# Patient Record
Sex: Male | Born: 1955
Health system: Southern US, Community
[De-identification: ages and names within clinical notes are randomized; demographics above are authoritative.]

## PROBLEM LIST (undated history)

## (undated) DIAGNOSIS — K219 Gastro-esophageal reflux disease without esophagitis: Secondary | ICD-10-CM

## (undated) DIAGNOSIS — N400 Enlarged prostate without lower urinary tract symptoms: Secondary | ICD-10-CM

## (undated) DIAGNOSIS — I10 Essential (primary) hypertension: Secondary | ICD-10-CM

## (undated) DIAGNOSIS — N529 Male erectile dysfunction, unspecified: Secondary | ICD-10-CM

## (undated) HISTORY — DX: Benign prostatic hyperplasia without lower urinary tract symptoms: N40.0

## (undated) HISTORY — DX: Male erectile dysfunction, unspecified: N52.9

---

## 1989-05-27 HISTORY — PX: OTHER SURGICAL HISTORY: SHX169

## 2002-05-27 HISTORY — PX: EYE SURGERY: SHX253

## 2006-03-20 ENCOUNTER — Emergency Department: Payer: Self-pay | Admitting: Emergency Medicine

## 2010-08-02 ENCOUNTER — Ambulatory Visit (INDEPENDENT_AMBULATORY_CARE_PROVIDER_SITE_OTHER): Payer: Managed Care, Other (non HMO) | Admitting: Family Medicine

## 2010-08-02 ENCOUNTER — Other Ambulatory Visit: Payer: Self-pay | Admitting: Family Medicine

## 2010-08-02 ENCOUNTER — Encounter: Payer: Self-pay | Admitting: Family Medicine

## 2010-08-02 DIAGNOSIS — R7309 Other abnormal glucose: Secondary | ICD-10-CM | POA: Insufficient documentation

## 2010-08-02 DIAGNOSIS — Z0000 Encounter for general adult medical examination without abnormal findings: Secondary | ICD-10-CM

## 2010-08-02 DIAGNOSIS — N401 Enlarged prostate with lower urinary tract symptoms: Secondary | ICD-10-CM | POA: Insufficient documentation

## 2010-08-02 DIAGNOSIS — Z23 Encounter for immunization: Secondary | ICD-10-CM

## 2010-08-02 DIAGNOSIS — E1165 Type 2 diabetes mellitus with hyperglycemia: Secondary | ICD-10-CM | POA: Insufficient documentation

## 2010-08-02 LAB — LIPID PANEL
LDL Cholesterol: 83 mg/dL (ref 0–99)
Total CHOL/HDL Ratio: 5

## 2010-08-02 LAB — BASIC METABOLIC PANEL
CO2: 28 mEq/L (ref 19–32)
Chloride: 102 mEq/L (ref 96–112)
Potassium: 4.6 mEq/L (ref 3.5–5.1)

## 2010-08-02 LAB — HEPATIC FUNCTION PANEL
ALT: 41 U/L (ref 0–53)
AST: 23 U/L (ref 0–37)
Bilirubin, Direct: 0.2 mg/dL (ref 0.0–0.3)
Total Bilirubin: 1 mg/dL (ref 0.3–1.2)
Total Protein: 7.1 g/dL (ref 6.0–8.3)

## 2010-08-02 LAB — PSA: PSA: 0.57 ng/mL (ref 0.10–4.00)

## 2010-08-02 LAB — HEMOGLOBIN A1C: Hgb A1c MFr Bld: 9.7 % — ABNORMAL HIGH (ref 4.6–6.5)

## 2010-08-07 NOTE — Assessment & Plan Note (Signed)
Summary: NEW PATIENT TO EST/CPX/CLE  AETNA   Vital Signs:  Patient profile:   55 year old male Height:      73 inches Weight:      235.50 pounds BMI:     31.18 Temp:     97.4 degrees F oral Pulse rate:   84 / minute Pulse rhythm:   regular BP sitting:   116 / 78  (left arm) Cuff size:   large  Vitals Entered By: Delilah Shan CMA Melodie Ashworth Dull) (August 02, 2010 9:38 AM) CC: New Patient to Establish   History of Present Illness: NP CPE.  See plan.    1 prev high reading of BP, but no dx of htn.    Prev with fasting sugar >200.  He has been checking his sugar occ at home.  120-180 in AM.  Lost 15lbs with diet changes.  Inc in UOP at night, less in the day.  Some thirst noted by patient.    H/o BPH.  Unclear history of cancer in the family.   H/o ED with response to cialis.    Occ abdominal pain at rest near epigastrum.  Worse after big meals.  No exertional symptoms.  No vomiting.  Not short of breath.   Preventive Screening-Counseling & Management  Alcohol-Tobacco     Smoking Status: current  Caffeine-Diet-Exercise     Does Patient Exercise: no  Allergies (verified): No Known Drug Allergies  Past History:  Past Medical History: DIABETES MELLITUS, TYPE II (ICD-250.00) SCREENING, COLON CANCER (ICD-V76.51) HEALTH SCREENING (ICD-V70.0) HYPERTROPHY PROSTATE W/UR OBST & OTH LUTS (ICD-600.01) FAMILY HISTORY DIABETES 1ST DEGREE RELATIVE (ICD-V18.0)    Past Surgical History: 1991  Plantar fasciitis surgery 2004  Lasik surgery  Family History: Reviewed history and no changes required. Family History Diabetes 1st degree relative, parents, grandparents Family History of Glaucoma, parents, grandparents F alive, glaucoma, CAD s/p stent M alive, glaucoma, DM2  Social History: Reviewed history and no changes required. Occupation:  Airline pilot, [employer], travels [employer] Education:  [employer] Married, 1987.  2 kids, 2 grandkids Current Smoker, cigars (2 or 3 per  month) Alcohol use-no [place]upation[place]  employed Smoking Status:  current Does Patient Exercise:  no  Review of Systems       See HPI.  Otherwise negative.    Physical Exam  General:  GEN: nad, alert and oriented HEENT: mucous membranes moist NECK: supple w/o LA CV: rrr.  no murmur PULM: ctab, no inc wob ABD: soft, +bs EXT: no edema SKIN: no acute rash  Prostate:  Prostate gland firm and smooth, ,minimal  enlargement, but no nodularity, tenderness, mass, asymmetry or induration.   Impression & Recommendations:  Problem # 1:  Preventive Health Care (ICD-V70.0) flu shot encouraged.  Tdap today.  D/w patient [id number] and exercise.  PSA collected.  see notes on labs.  I think he likely has gerd- benign abdominal exam.  I would rec otc tx and follow up as needed.   Problem # 2:  HYPERGLYCEMIA (ICD-790.29) see notes on labs . Orders: TLB-BMP (Basic Metabolic Panel-BMET) (80048-METABOL) TLB-Hepatic/Liver Function Pnl (80076-HEPATIC) TLB-Lipid Panel (80061-LIPID) TLB-A1C / Hgb A1C (Glycohemoglobin) (83036-A1C)  Problem # 3:  HYPERTROPHY PROSTATE W/UR OBST & OTH LUTS (ICD-600.01) psa wnl.  no change in meds for this.   Orders: TLB-PSA (Prostate Specific Antigen) (84153-PSA)  Other Orders: Venipuncture (04540) Specimen Handling (98119) Tdap => 76yrs IM (14782) Admin 1st Vaccine (95621) Gastroenterology Referral (GI)  Patient Instructions: 1)  We'll contact you with your lab report and let you know about the details at that point.  2)  Look at the low carb/eat right diet and try to cut out sugar and starches. 3)  Increase your exercise- start walking 3 times a week for 4)  See Shirlee Limerick about your referral before your leave today.   5)  Drop off a copy of your old labs.   6)  You can take tums for your abdominal pain.  See if that helps.     Orders Added: 1)  New Patient 40-64 years [99386] 2)  Venipuncture [57846] 3)   Specimen Handling [99000] 4)  Tdap => 37yrs IM [90715] 5)  Admin 1st Vaccine [90471] 6)  Gastroenterology Referral [GI] 7)  TLB-BMP (Basic Metabolic Panel-BMET) [80048-METABOL] 8)  TLB-Hepatic/Liver Function Pnl [80076-HEPATIC] 9)  TLB-Lipid Panel [80061-LIPID] 10)  TLB-A1C / Hgb A1C (Glycohemoglobin) [83036-A1C] 11)  TLB-PSA (Prostate Specific Antigen) [96295-MWU]   Immunizations Administered:  Tetanus Vaccine:    Vaccine Type: Tdap    Site: left deltoid    Mfr: GlaxoSmithKline    Dose: 0.5 ml    Route: IM    Given by: Delilah Shan CMA (AAMA)    Exp. Date: 03/15/2012    Lot #: [id number]    VIS given: 04/13/08 version given August 02, 2010.   Immunizations Administered:  Tetanus Vaccine:    Vaccine Type: Tdap    Site: left deltoid    Mfr: GlaxoSmithKline    Dose: 0.5 ml    Route: IM    Given by: Delilah Shan CMA (AAMA)    Exp. Date: 03/15/2012    Lot #: [id number]    VIS given: 04/13/08 version given August 02, 2010.  Prior Medications (reviewed today): None Current Allergies (reviewed today): No known allergies

## 2010-08-20 ENCOUNTER — Ambulatory Visit: Payer: Self-pay | Admitting: Gastroenterology

## 2010-10-27 ENCOUNTER — Encounter: Payer: Self-pay | Admitting: Family Medicine

## 2010-11-02 ENCOUNTER — Other Ambulatory Visit (INDEPENDENT_AMBULATORY_CARE_PROVIDER_SITE_OTHER): Payer: Managed Care, Other (non HMO) | Admitting: Family Medicine

## 2010-11-02 ENCOUNTER — Other Ambulatory Visit: Payer: Managed Care, Other (non HMO)

## 2010-11-02 DIAGNOSIS — E119 Type 2 diabetes mellitus without complications: Secondary | ICD-10-CM

## 2010-11-02 LAB — HEMOGLOBIN A1C: Hgb A1c MFr Bld: 7.7 % — ABNORMAL HIGH (ref 4.6–6.5)

## 2010-11-08 ENCOUNTER — Ambulatory Visit (INDEPENDENT_AMBULATORY_CARE_PROVIDER_SITE_OTHER): Payer: Managed Care, Other (non HMO) | Admitting: Family Medicine

## 2010-11-08 ENCOUNTER — Encounter: Payer: Self-pay | Admitting: Family Medicine

## 2010-11-08 VITALS — BP 110/78 | HR 88 | Temp 98.5°F | Ht 73.0 in | Wt 235.0 lb

## 2010-11-08 DIAGNOSIS — E119 Type 2 diabetes mellitus without complications: Secondary | ICD-10-CM

## 2010-11-08 NOTE — Patient Instructions (Addendum)
Check the American Diabetes Association website.  Keep working on your diet and see if the numbness in your R foot changes any.  If it gets worse, let me know.  Check your sugar episodically.   Call me if you have questions.  Call the pharmacy about getting the strips for your meter (let them call us). Come back for labs in 4 months and see me a few days after that.

## 2010-11-08 NOTE — Progress Notes (Signed)
Diabetes:  Using medications without difficulties:no meds Hypoglycemic episodes:occ and he snacks to resolve it Hyperglycemic episodes:no Feet problems:no Blood Sugars averaging: 150-170 now, down from ~215 before Walking for exercise, down about 10lbs.  Normal eye exam per patient in last month.  Labs dw pt.  R 1st toe is numb, worse during the day.  This may be positional.  No other neuro sx.    PMH and SH reviewed  Meds, vitals, and allergies reviewed.   ROS: See HPI.  Otherwise negative.    GEN: nad, alert and oriented HEENT: mucous membranes moist NECK: supple w/o LA CV: rrr. PULM: ctab, no inc wob ABD: soft, +bs EXT: no edema SKIN: no acute rash  Diabetic foot exam: Normal inspection No skin breakdown No calluses  Normal DP pulses Normal sensation to light touch and monofilament except on R first toe Nails normal

## 2010-11-08 NOTE — Assessment & Plan Note (Signed)
Improved with diet and weight loss.  Labs d/w pt.  Continue as is and fu for labs as scheduled.  No new meds. He understood.

## 2011-01-21 ENCOUNTER — Other Ambulatory Visit: Payer: Self-pay | Admitting: *Deleted

## 2011-01-21 MED ORDER — LANCETS MISC: Status: AC

## 2011-01-21 MED ORDER — GLUCOSE BLOOD VI STRP: ORAL_STRIP | Status: AC

## 2011-01-21 NOTE — Telephone Encounter (Signed)
Please call pt. He doesn't have to check glucose daily if the trend has been stable; he can just check it episodically.  Thanks.

## 2011-01-21 NOTE — Telephone Encounter (Signed)
Pt is asking that test strips and lancets [facility] [address]  These are not on his med list, added for sake of refilling.  Pt states he checks his blood sugar 3 or 4 times a day.

## 2011-01-22 NOTE — Telephone Encounter (Signed)
LMOVM to return call.

## 2011-01-23 NOTE — Telephone Encounter (Signed)
LMOVM of home number. 

## 2011-02-28 ENCOUNTER — Other Ambulatory Visit (INDEPENDENT_AMBULATORY_CARE_PROVIDER_SITE_OTHER): Payer: Managed Care, Other (non HMO)

## 2011-02-28 DIAGNOSIS — E119 Type 2 diabetes mellitus without complications: Secondary | ICD-10-CM

## 2011-03-06 ENCOUNTER — Ambulatory Visit: Payer: Managed Care, Other (non HMO) | Admitting: Family Medicine

## 2011-03-08 ENCOUNTER — Ambulatory Visit (INDEPENDENT_AMBULATORY_CARE_PROVIDER_SITE_OTHER): Payer: Managed Care, Other (non HMO) | Admitting: Family Medicine

## 2011-03-08 ENCOUNTER — Encounter: Payer: Self-pay | Admitting: Family Medicine

## 2011-03-08 ENCOUNTER — Encounter: Payer: Managed Care, Other (non HMO) | Admitting: Family Medicine

## 2011-03-08 VITALS — BP 132/86 | HR 86 | Temp 98.4°F | Wt 236.0 lb

## 2011-03-08 DIAGNOSIS — Z23 Encounter for immunization: Secondary | ICD-10-CM

## 2011-03-08 DIAGNOSIS — E119 Type 2 diabetes mellitus without complications: Secondary | ICD-10-CM

## 2011-03-08 DIAGNOSIS — Z0100 Encounter for examination of eyes and vision without abnormal findings: Secondary | ICD-10-CM

## 2011-03-08 DIAGNOSIS — Z029 Encounter for administrative examinations, unspecified: Secondary | ICD-10-CM

## 2011-03-08 DIAGNOSIS — Z0110 Encounter for examination of ears and hearing without abnormal findings: Secondary | ICD-10-CM

## 2011-03-08 LAB — POCT URINALYSIS DIPSTICK
Bilirubin, UA: NEGATIVE
Glucose, UA: 250
Ketones, UA: NEGATIVE
Leukocytes, UA: NEGATIVE
Nitrite, UA: NEGATIVE

## 2011-03-08 NOTE — Progress Notes (Signed)
Diabetes:  Using medications without difficulties:no meds Hypoglycemic episodes:no Hyperglycemic episodes:no Feet problems:no Blood Sugars averaging: 150-175 2 hours after a meal, checked episodically  eye exam within last year: yes He's working on low carb diet. He had job changes and that affected exercise routine, but he's working on this  DOT physical. See scanned forms.    PMH and SH reviewed  Meds, vitals, and allergies reviewed.   ROS: See HPI.  Otherwise negative.    GEN: nad, alert and oriented HEENT: mucous membranes moist NECK: supple w/o LA CV: rrr. PULM: ctab, no inc wob ABD: soft, +bs EXT: no edema SKIN: no acute rash See additional items on scanned forms.    Diabetic foot exam: Normal inspection No skin breakdown No calluses  Normal DP pulses Normal sensation to light touch and monofilament Nails normal   Labs d/w pt.

## 2011-03-08 NOTE — Patient Instructions (Signed)
I want to recheck your labs in 3 months before a OV.  Come in fasting for labs.   I want your to work on your diet and start back exercising.   Let me know if your have concerns in the meantime.

## 2011-03-10 ENCOUNTER — Encounter: Payer: Self-pay | Admitting: Family Medicine

## 2011-03-10 DIAGNOSIS — Z029 Encounter for administrative examinations, unspecified: Secondary | ICD-10-CM | POA: Insufficient documentation

## 2011-03-10 NOTE — Assessment & Plan Note (Signed)
Labs d/w pt, along with diet and exercise.  >25 min spent with face to face with patient, >50% counseling.  Return for labs in 3 months.  He agrees.

## 2011-03-10 NOTE — Assessment & Plan Note (Signed)
DOT forms done, see scanned forms.

## 2011-03-14 ENCOUNTER — Encounter: Payer: Managed Care, Other (non HMO) | Admitting: Family Medicine

## 2011-06-06 ENCOUNTER — Other Ambulatory Visit: Payer: Managed Care, Other (non HMO)

## 2011-06-13 ENCOUNTER — Ambulatory Visit: Payer: Managed Care, Other (non HMO) | Admitting: Family Medicine

## 2011-07-02 ENCOUNTER — Other Ambulatory Visit (INDEPENDENT_AMBULATORY_CARE_PROVIDER_SITE_OTHER): Payer: Managed Care, Other (non HMO)

## 2011-07-02 DIAGNOSIS — E119 Type 2 diabetes mellitus without complications: Secondary | ICD-10-CM

## 2011-07-02 LAB — COMPREHENSIVE METABOLIC PANEL
Albumin: 4.3 g/dL (ref 3.5–5.2)
Alkaline Phosphatase: 69 U/L (ref 39–117)
BUN: 15 mg/dL (ref 6–23)
CO2: 28 mEq/L (ref 19–32)
GFR: 64.42 mL/min (ref >60.00)
Glucose, Bld: 242 mg/dL — ABNORMAL HIGH (ref 70–99)
Sodium: 136 mEq/L (ref 135–145)
Total Bilirubin: 0.9 mg/dL (ref 0.3–1.2)
Total Protein: 7.5 g/dL (ref 6.0–8.3)

## 2011-07-02 LAB — MICROALBUMIN / CREATININE URINE RATIO
Microalb Creat Ratio: 0.6 mg/g (ref 0.0–30.0)
Microalb, Ur: 1.4 mg/dL (ref 0.0–1.9)

## 2011-07-02 LAB — LIPID PANEL
Cholesterol: 152 mg/dL (ref 0–200)
HDL: 34.5 mg/dL — ABNORMAL LOW (ref >39.00)
VLDL: 34.4 mg/dL (ref 0.0–40.0)

## 2011-07-02 LAB — HEMOGLOBIN A1C: Hgb A1c MFr Bld: 9.2 % — ABNORMAL HIGH (ref 4.6–6.5)

## 2011-07-04 ENCOUNTER — Other Ambulatory Visit: Payer: Managed Care, Other (non HMO)

## 2011-07-11 ENCOUNTER — Encounter: Payer: Self-pay | Admitting: Family Medicine

## 2011-07-11 ENCOUNTER — Ambulatory Visit (INDEPENDENT_AMBULATORY_CARE_PROVIDER_SITE_OTHER): Payer: Managed Care, Other (non HMO) | Admitting: Family Medicine

## 2011-07-11 VITALS — BP 112/84 | HR 83 | Temp 98.2°F | Wt 234.0 lb

## 2011-07-11 DIAGNOSIS — E119 Type 2 diabetes mellitus without complications: Secondary | ICD-10-CM

## 2011-07-11 MED ORDER — METFORMIN HCL 500 MG PO TABS: ORAL_TABLET | ORAL | Status: DC

## 2011-07-11 NOTE — Progress Notes (Signed)
Diabetes:  Using medications without difficulties:no meds Hypoglycemic episodes:no Hyperglycemic episodes:yes Feet problems: no Blood Sugars averaging: ~200 fasting a1c back up to 9.2.   He's skipping breakfast.  We talked about this.    PMH and SH reviewed  Meds, vitals, and allergies reviewed.   ROS: See HPI.  Otherwise negative.    GEN: nad, alert and oriented HEENT: mucous membranes moist NECK: supple w/o LA CV: rrr. PULM: ctab, no inc wob ABD: soft, +bs EXT: no edema SKIN: no acute rash  Diabetic foot exam: Normal inspection No skin breakdown No calluses  Normal DP pulses Normal sensation to light touch and monofilament Nails normal

## 2011-07-11 NOTE — Patient Instructions (Signed)
Look up the american diabetes association and read about type 2 DM.   Start eating breakfast.   Recheck A1c in 3 months and come see me a few days after that.  Gradually increase the metformin, up to 2 pills twice a day.

## 2011-07-15 NOTE — Assessment & Plan Note (Signed)
Uncontrolled, start metformin, eat 3 meals a day.  D/w pt about path/phys of DM2.  >25 min spent with face to face with patient, >50% counseling and/or coordinating care.  Recheck A1c in 3 months.

## 2011-09-18 ENCOUNTER — Encounter: Payer: Self-pay | Admitting: Family Medicine

## 2011-09-18 ENCOUNTER — Ambulatory Visit (INDEPENDENT_AMBULATORY_CARE_PROVIDER_SITE_OTHER): Payer: Managed Care, Other (non HMO) | Admitting: Family Medicine

## 2011-09-18 VITALS — BP 140/90 | HR 58 | Temp 100.5°F | Wt 227.0 lb

## 2011-09-18 DIAGNOSIS — J069 Acute upper respiratory infection, unspecified: Secondary | ICD-10-CM

## 2011-09-18 MED ORDER — AMOXICILLIN-POT CLAVULANATE 875-125 MG PO TABS: 1.0000 | ORAL_TABLET | Freq: Two times a day (BID) | ORAL | Status: AC

## 2011-09-18 MED ORDER — HYDROCODONE-HOMATROPINE 5-1.5 MG/5ML PO SYRP: 5.0000 mL | ORAL_SOLUTION | Freq: Three times a day (TID) | ORAL | Status: DC | PRN

## 2011-09-18 NOTE — Progress Notes (Signed)
SUBJECTIVE:  Matthew Burns is a 56 y.o. male who complains of coryza, congestion, sneezing, sore throat, myalgias and fever for 10 days. He denies a history of anorexia, chest pain, chills and dizziness and denies a history of asthma. Patient admits to smoke cigarettes.   Patient Active Problem List  Diagnoses  . DIABETES MELLITUS, TYPE II  . HYPERTROPHY PROSTATE W/UR OBST & OTH LUTS  . Encounter for administrative examinations   Past Medical History  Diagnosis Date  . Diabetes mellitus     typeII  . BPH (benign prostatic hypertrophy)    Past Surgical History  Procedure Date  . Planter fascitis surgery 1991  . Eye surgery 2004    Lasik   History  Substance Use Topics  . Smoking status: Former Smoker    Types: Cigars    Quit date: 08/09/2010  . Smokeless tobacco: Never Used  . Alcohol Use: No   Family History  Problem Relation Age of Onset  . Diabetes Mother     glaucoma  . Heart disease Father     CAD s/p stent//glaucoma  . Diabetes Maternal Grandmother   . Diabetes Maternal Grandfather   . Diabetes Paternal Grandmother   . Diabetes Paternal Grandfather    No Known Allergies Current Outpatient Prescriptions on File Prior to Visit  Medication Sig Dispense Refill  . glucose blood (ONE TOUCH ULTRA TEST) test strip Use as instructed  100 each  3  . Lancets MISC Use as directed  100 each  3  . metFORMIN (GLUCOPHAGE) 500 MG tablet Take up to 2 pills twice a day  360 tablet  3   The PMH, PSH, Social History, Family History, Medications, and allergies have been reviewed in [facility], and have been updated if relevant.  OBJECTIVE: BP 140/90  Pulse 58  Temp(Src) 100.5 F (38.1 C) (Oral)  Wt 227 lb (102.967 kg)  He appears well, vital signs are as noted. Ears normal.  Throat and pharynx normal.  Neck supple. No adenopathy in the neck. Nose is congested. Sinuses non tender. The chest is clear, without wheezes or rales.  ASSESSMENT:  sinusitis  PLAN: Given duration  and progression of symptoms, will treat for bacterial sinusitis. Symptomatic therapy suggested: push fluids, rest and return office visit prn if symptoms persist or worsen.  Call or return to clinic prn if these symptoms worsen or fail to improve as anticipated.

## 2011-09-18 NOTE — Patient Instructions (Signed)
Take antibiotic as directed- Augmentin - 1 tablet twice daily for 10 days.  Drink lots of fluids.  Treat sympotmatically with Mucinex, nasal saline irrigation, and Tylenol/Ibuprofen. Also try claritin D or zyrtec D over the counter- two times a day as needed ( have to sign for them at pharmacy). You can use warm compresses.  Cough suppressant at night. Call if not improving as expected in 5-7 days.    

## 2011-09-26 ENCOUNTER — Other Ambulatory Visit: Payer: Self-pay

## 2011-09-26 MED ORDER — HYDROCODONE-HOMATROPINE 5-1.5 MG/5ML PO SYRP: 5.0000 mL | ORAL_SOLUTION | Freq: Three times a day (TID) | ORAL | Status: DC | PRN

## 2011-09-26 NOTE — Telephone Encounter (Signed)
Pt walked in; pt saw Dr Dayton Martes 09/18/11. Pt has one more day of antibiotic and is feeling better except non productive cough started again yesterday. No wheezing, no trouble brathing and no fever. Pt request Hycod[facility] refill [facility] to [facility] and pt can be reached at [phone] or [phone].Please advise.

## 2011-09-26 NOTE — Telephone Encounter (Signed)
[facility], [facility] advised.

## 2011-10-01 ENCOUNTER — Other Ambulatory Visit (INDEPENDENT_AMBULATORY_CARE_PROVIDER_SITE_OTHER): Payer: Managed Care, Other (non HMO)

## 2011-10-01 DIAGNOSIS — E119 Type 2 diabetes mellitus without complications: Secondary | ICD-10-CM

## 2011-10-08 ENCOUNTER — Ambulatory Visit: Payer: Managed Care, Other (non HMO) | Admitting: Family Medicine

## 2011-10-09 ENCOUNTER — Ambulatory Visit (INDEPENDENT_AMBULATORY_CARE_PROVIDER_SITE_OTHER): Payer: Managed Care, Other (non HMO) | Admitting: Family Medicine

## 2011-10-09 ENCOUNTER — Encounter: Payer: Self-pay | Admitting: Family Medicine

## 2011-10-09 VITALS — BP 144/90 | HR 84 | Temp 98.3°F | Ht 73.0 in | Wt 234.5 lb

## 2011-10-09 DIAGNOSIS — E119 Type 2 diabetes mellitus without complications: Secondary | ICD-10-CM

## 2011-10-09 MED ORDER — METFORMIN HCL 1000 MG PO TABS: 1000.0000 mg | ORAL_TABLET | ORAL | Status: DC

## 2011-10-09 NOTE — Assessment & Plan Note (Signed)
Continue work on diet.  No change in meds, continue as is.  Recheck A1c in 3 months, consider ACE at that point.  Will need lipids rechecked as weight/A1c come down.  He understood.

## 2011-10-09 NOTE — Progress Notes (Signed)
Diabetes:  Using medications without difficulties:yes, max 1000mg  of metformin a day Hypoglycemic episodes: yes but improved Hyperglycemic episodes:no Feet problems: no Blood Sugars averaging: ~200 in AM and this is improved.  He's working on diet, working on portion control and getting 3 meals a day.    A1c improved to 8.1.    Meds, vitals, and allergies reviewed.   ROS: See HPI.  Otherwise negative.    GEN: nad, alert and oriented HEENT: mucous membranes moist NECK: supple w/o LA CV: rrr. PULM: ctab, no inc wob ABD: soft, +bs EXT: no edema SKIN: no acute rash

## 2011-10-09 NOTE — Patient Instructions (Signed)
Recheck A1c in 3 months.  Come see me after that.  Keep working on [employer] and walking.

## 2012-01-09 ENCOUNTER — Other Ambulatory Visit (INDEPENDENT_AMBULATORY_CARE_PROVIDER_SITE_OTHER): Payer: Managed Care, Other (non HMO)

## 2012-01-09 DIAGNOSIS — E119 Type 2 diabetes mellitus without complications: Secondary | ICD-10-CM

## 2012-01-09 LAB — HEMOGLOBIN A1C: Hgb A1c MFr Bld: 7.8 % — ABNORMAL HIGH (ref 4.6–6.5)

## 2012-01-16 ENCOUNTER — Encounter: Payer: Self-pay | Admitting: Family Medicine

## 2012-01-16 ENCOUNTER — Ambulatory Visit (INDEPENDENT_AMBULATORY_CARE_PROVIDER_SITE_OTHER): Payer: Managed Care, Other (non HMO) | Admitting: Family Medicine

## 2012-01-16 VITALS — BP 120/84 | HR 78 | Temp 98.3°F | Wt 231.8 lb

## 2012-01-16 DIAGNOSIS — R05 Cough: Secondary | ICD-10-CM

## 2012-01-16 DIAGNOSIS — N529 Male erectile dysfunction, unspecified: Secondary | ICD-10-CM

## 2012-01-16 DIAGNOSIS — Z125 Encounter for screening for malignant neoplasm of prostate: Secondary | ICD-10-CM

## 2012-01-16 DIAGNOSIS — E119 Type 2 diabetes mellitus without complications: Secondary | ICD-10-CM

## 2012-01-16 MED ORDER — TADALAFIL 20 MG PO TABS: 10.0000 mg | ORAL_TABLET | ORAL | Status: DC | PRN

## 2012-01-16 MED ORDER — HYDROCODONE-HOMATROPINE 5-1.5 MG/5ML PO SYRP: 5.0000 mL | ORAL_SOLUTION | Freq: Three times a day (TID) | ORAL | Status: AC | PRN

## 2012-01-16 NOTE — Progress Notes (Signed)
Diabetes:  Using medications without difficulties:yes Hypoglycemic episodes: no Hyperglycemic episodes:no Feet problems: no Blood Sugars averaging: not checked recently.   Getting 3 meals a day, losing weight.  Exercising moderately.   A1c improved to 7.8.    ED.  Had used cialis w/o ADE prev. Asking about restarting med. Intolerant of viagra.   Had been coughing last week, likely with a cold.  Better now.  No fevers.  Had used some leftover hydrocodone from prev illness with some relief.    PMH and SH reviewed  Meds, vitals, and allergies reviewed.   ROS: See HPI.  Otherwise negative.    GEN: nad, alert and oriented HEENT: mucous membranes moist, TM/nasal/OP exam wnl NECK: supple w/o LA CV: rrr. PULM: ctab, no inc wob ABD: soft, +bs EXT: no edema SKIN: no acute rash

## 2012-01-16 NOTE — Patient Instructions (Addendum)
Keep working on your weight and recheck labs before a physical in 6 months.  Take care.   Use the cough medicine as needed for a few days.  Sedation caution.

## 2012-01-16 NOTE — Assessment & Plan Note (Signed)
Consider ACE and statin in future.  BP okay for now.  He'll continue work on diet and exercise.  He can likely get A1c lower w/o other meds.  He agrees.  See instructions.  Labs d/w pt. Continue current meds.

## 2012-01-16 NOTE — Assessment & Plan Note (Signed)
Restart cialis, has tolerated prev.

## 2012-01-16 NOTE — Assessment & Plan Note (Signed)
Lungs cleaer, likely has mild postinfectious sx.  D/w pt.  Can use hycodan prn for a few days.  Fu prn

## 2012-03-10 ENCOUNTER — Other Ambulatory Visit: Payer: Managed Care, Other (non HMO)

## 2012-07-13 ENCOUNTER — Other Ambulatory Visit (INDEPENDENT_AMBULATORY_CARE_PROVIDER_SITE_OTHER): Payer: Managed Care, Other (non HMO)

## 2012-07-13 DIAGNOSIS — Z125 Encounter for screening for malignant neoplasm of prostate: Secondary | ICD-10-CM

## 2012-07-13 DIAGNOSIS — E119 Type 2 diabetes mellitus without complications: Secondary | ICD-10-CM

## 2012-07-13 LAB — COMPREHENSIVE METABOLIC PANEL
Albumin: 3.9 g/dL (ref 3.5–5.2)
CO2: 27 mEq/L (ref 19–32)
Calcium: 8.5 mg/dL (ref 8.4–10.5)
Chloride: 97 mEq/L (ref 96–112)
GFR: 76.08 mL/min (ref >60.00)
Glucose, Bld: 210 mg/dL — ABNORMAL HIGH (ref 70–99)
Potassium: 3.8 mEq/L (ref 3.5–5.1)
Sodium: 134 mEq/L — ABNORMAL LOW (ref 135–145)
Total Protein: 6.8 g/dL (ref 6.0–8.3)

## 2012-07-13 LAB — LIPID PANEL: HDL: 27.4 mg/dL — ABNORMAL LOW (ref >39.00)

## 2012-07-13 LAB — HEMOGLOBIN A1C: Hgb A1c MFr Bld: 9.5 % — ABNORMAL HIGH (ref 4.6–6.5)

## 2012-07-20 ENCOUNTER — Encounter: Payer: Self-pay | Admitting: Family Medicine

## 2012-07-20 ENCOUNTER — Ambulatory Visit (INDEPENDENT_AMBULATORY_CARE_PROVIDER_SITE_OTHER): Payer: Managed Care, Other (non HMO) | Admitting: Family Medicine

## 2012-07-20 VITALS — BP 142/84 | HR 81 | Temp 98.9°F | Wt 227.0 lb

## 2012-07-20 DIAGNOSIS — Z8042 Family history of malignant neoplasm of prostate: Secondary | ICD-10-CM

## 2012-07-20 DIAGNOSIS — E119 Type 2 diabetes mellitus without complications: Secondary | ICD-10-CM

## 2012-07-20 DIAGNOSIS — Z0000 Encounter for general adult medical examination without abnormal findings: Secondary | ICD-10-CM

## 2012-07-20 DIAGNOSIS — R1011 Right upper quadrant pain: Secondary | ICD-10-CM

## 2012-07-20 NOTE — Patient Instructions (Addendum)
See Shirlee Limerick about your referral before you leave today. We'll contact you with your imaging report.  Start back on the metformin and recheck A1c in 3 months before a visit with Para March.  Take care.  Keep working on your weight.

## 2012-07-20 NOTE — Assessment & Plan Note (Signed)
Possible GB pathology, d/w pt.  Check u/s and we'll notify him at that point.  LFTs and exam unremarkable.

## 2012-07-20 NOTE — Assessment & Plan Note (Signed)
He'll start back on the metformin and recheck A1c in 3 months before a visit.  Continue to work on diet and weight- discussed.  Labs discussed.

## 2012-07-20 NOTE — Progress Notes (Signed)
CPE- See plan.  Routine anticipatory guidance given to patient.  See health maintenance. Tetanus 2012 Flu shot prev done, 1/14 Colonoscopy 2012, was told 3 year f/u prev and he'll request a record of the procedure.   PSA wnl- FH noted.  Diet and exercise- working on both.  Has a living will.  Would want his wife to speak for him if incapacitated.   Diabetes:  Using medications without difficulties:off metformin.  Hypoglycemic episodes: only once in last 6 months Hyperglycemic episodes: as below Feet problems: no Blood Sugars averaging: 280 last night, as low as 170 eye exam within last year: yes A1c up.  Wants to recheck in 3 months.   He's had RUQ pain in the back and radiates to the front.  3 episodes, at least, in last few months.  Last 45 minutes.  Usually about 1h after heating.  Had eaten pasta before two of the episodes.    PMH and SH reviewed  Meds, vitals, and allergies reviewed.   ROS: See HPI.  Otherwise negative.    GEN: nad, alert and oriented HEENT: mucous membranes moist NECK: supple w/o LA CV: rrr. PULM: ctab, no inc wob ABD: soft, +bs, murphy neg, abd not ttp.  EXT: no edema SKIN: no acute rash  Diabetic foot exam: Normal inspection No skin breakdown No calluses  Normal DP pulses Normal sensation to light touch and monofilament Nails normal

## 2012-07-20 NOTE — Assessment & Plan Note (Signed)
PSA wnl.  Can recheck yearly. D/w pt.

## 2012-07-20 NOTE — Assessment & Plan Note (Signed)
Routine anticipatory guidance given to patient.  See health maintenance. Tetanus 2012 Flu shot prev done, 1/14 Colonoscopy 2012, was told 3 year f/u prev and he'll request a record of the procedure.   PSA wnl- FH noted.  Diet and exercise- working on both.  Has a living will.  Would want his wife to speak for him if incapacitated.

## 2012-07-22 ENCOUNTER — Ambulatory Visit: Payer: Self-pay | Admitting: Family Medicine

## 2012-07-22 ENCOUNTER — Telehealth: Payer: Self-pay

## 2012-07-22 ENCOUNTER — Encounter: Payer: Self-pay | Admitting: Family Medicine

## 2012-07-22 ENCOUNTER — Other Ambulatory Visit: Payer: Self-pay | Admitting: Family Medicine

## 2012-07-22 DIAGNOSIS — R1011 Right upper quadrant pain: Secondary | ICD-10-CM

## 2012-07-22 NOTE — Telephone Encounter (Signed)
See u/s result note.  Please call pt. Thanks.

## 2012-07-22 NOTE — Telephone Encounter (Signed)
Kim left v/m requesting [place] results and pt is slightly hurting todayand wants to know what Dr Para March thinks is going on. Please advise. Spoke with pt and he said pain was not bad just slightly hurting today but would like call back today about [place].

## 2012-07-23 ENCOUNTER — Other Ambulatory Visit: Payer: Self-pay | Admitting: Family Medicine

## 2012-07-23 NOTE — Telephone Encounter (Signed)
Dr. Para March phoned the patient.  See [place] report.

## 2012-08-13 ENCOUNTER — Ambulatory Visit: Payer: Self-pay | Admitting: Family Medicine

## 2012-08-14 ENCOUNTER — Telehealth: Payer: Self-pay | Admitting: Family Medicine

## 2012-08-14 DIAGNOSIS — R1011 Right upper quadrant pain: Secondary | ICD-10-CM

## 2012-08-14 NOTE — Telephone Encounter (Signed)
Patient advised.

## 2012-08-14 NOTE — Telephone Encounter (Signed)
Call pt.  The hepatobiliary scan was normal.  I don't see a reason yet for the RUQ pain.  If he continues to have symptoms, then we should get him to see GI.  Let me know if I need to put in the referral.

## 2012-08-24 ENCOUNTER — Encounter: Payer: Self-pay | Admitting: Family Medicine

## 2012-10-15 ENCOUNTER — Other Ambulatory Visit (INDEPENDENT_AMBULATORY_CARE_PROVIDER_SITE_OTHER): Payer: Managed Care, Other (non HMO)

## 2012-10-15 DIAGNOSIS — E119 Type 2 diabetes mellitus without complications: Secondary | ICD-10-CM

## 2012-10-20 ENCOUNTER — Other Ambulatory Visit: Payer: Managed Care, Other (non HMO)

## 2012-10-26 ENCOUNTER — Encounter: Payer: Self-pay | Admitting: Family Medicine

## 2012-10-26 ENCOUNTER — Ambulatory Visit (INDEPENDENT_AMBULATORY_CARE_PROVIDER_SITE_OTHER): Payer: Managed Care, Other (non HMO) | Admitting: Family Medicine

## 2012-10-26 VITALS — BP 132/88 | HR 86 | Temp 98.2°F | Wt 221.2 lb

## 2012-10-26 DIAGNOSIS — E119 Type 2 diabetes mellitus without complications: Secondary | ICD-10-CM

## 2012-10-26 NOTE — Assessment & Plan Note (Signed)
Much improved.  Labs discussed. He's working hard on diet and exercise.  Continue as is. Recheck in 6 months.  I don't think his foot sx are likely related to DM2.  He'll follow this and notify me if needed.  He'll call about eye clinic f/u.

## 2012-10-26 NOTE — Progress Notes (Signed)
Diabetes:  Using medications without difficulties:yes Hypoglycemic episodes:no Hyperglycemic episodes:no Feet problems:some tingling noted in the feet, at the end of the day, not every day.   Blood Sugars averaging: ~90 in AM, ~130 later in the day eye exam within last year: discussed- he is scheduled.   He is losing weight intentionally and his RUQ pain is resolved. He had an unremarkable w/u.   He's working on diet and exercise with his wife.  Portion control.  "eating better."  Exercise- walking a few days a week.    Meds, vitals, and allergies reviewed.   ROS: See HPI.  Otherwise negative.    GEN: nad, alert and oriented HEENT: mucous membranes moist NECK: supple w/o LA CV: rrr. PULM: ctab, no inc wob ABD: soft, +bs EXT: no edema SKIN: no acute rash but SK noted on R lower leg  Diabetic foot exam: Normal inspection No skin breakdown No calluses  Normal DP pulses Normal sensation to light touch and monofilament Nails normal

## 2012-10-26 NOTE — Patient Instructions (Addendum)
See if certain shoes or activities affect the foot sensation.   Recheck A1c in 6 months before a visit.   I would check your sugar episodically.   Take care.  Glad to see you.

## 2012-12-03 ENCOUNTER — Other Ambulatory Visit: Payer: Self-pay

## 2013-01-19 ENCOUNTER — Encounter: Payer: Self-pay | Admitting: Family Medicine

## 2013-04-01 ENCOUNTER — Other Ambulatory Visit: Payer: Self-pay

## 2013-04-20 ENCOUNTER — Other Ambulatory Visit: Payer: Managed Care, Other (non HMO)

## 2013-04-21 ENCOUNTER — Other Ambulatory Visit: Payer: Self-pay | Admitting: Family Medicine

## 2013-04-21 DIAGNOSIS — E119 Type 2 diabetes mellitus without complications: Secondary | ICD-10-CM

## 2013-04-27 ENCOUNTER — Other Ambulatory Visit (INDEPENDENT_AMBULATORY_CARE_PROVIDER_SITE_OTHER): Payer: Managed Care, Other (non HMO)

## 2013-04-27 ENCOUNTER — Other Ambulatory Visit: Payer: Managed Care, Other (non HMO)

## 2013-04-27 ENCOUNTER — Ambulatory Visit: Payer: Managed Care, Other (non HMO) | Admitting: Family Medicine

## 2013-04-27 DIAGNOSIS — E119 Type 2 diabetes mellitus without complications: Secondary | ICD-10-CM

## 2013-04-28 LAB — HEMOGLOBIN A1C: Hgb A1c MFr Bld: 9.6 % — ABNORMAL HIGH (ref 4.6–6.5)

## 2013-05-04 ENCOUNTER — Ambulatory Visit (INDEPENDENT_AMBULATORY_CARE_PROVIDER_SITE_OTHER): Payer: Managed Care, Other (non HMO) | Admitting: Family Medicine

## 2013-05-04 ENCOUNTER — Encounter: Payer: Self-pay | Admitting: Family Medicine

## 2013-05-04 VITALS — BP 116/80 | HR 90 | Temp 98.3°F | Wt 227.0 lb

## 2013-05-04 DIAGNOSIS — IMO0001 Reserved for inherently not codable concepts without codable children: Secondary | ICD-10-CM

## 2013-05-04 DIAGNOSIS — E119 Type 2 diabetes mellitus without complications: Secondary | ICD-10-CM

## 2013-05-04 DIAGNOSIS — Z23 Encounter for immunization: Secondary | ICD-10-CM

## 2013-05-04 DIAGNOSIS — E1165 Type 2 diabetes mellitus with hyperglycemia: Secondary | ICD-10-CM

## 2013-05-04 LAB — MICROALBUMIN / CREATININE URINE RATIO: Microalb, Ur: 1.5 mg/dL (ref 0.0–1.9)

## 2013-05-04 MED ORDER — METFORMIN HCL 1000 MG PO TABS: 1000.0000 mg | ORAL_TABLET | Freq: Every day | ORAL | Status: DC

## 2013-05-04 NOTE — Patient Instructions (Signed)
Go to the lab on the way out.  We'll contact you with your lab report. Start back on metformin (1/2 tab at night for about 1-2 weeks, then 1 tab at night).  Recheck A1c before a visit in about 3 months.  Take care.

## 2013-05-04 NOTE — Assessment & Plan Note (Signed)
A1c discussed.  See instructions.  Start back on metformin, check A1c today.  Normal foot exam.

## 2013-05-04 NOTE — Progress Notes (Signed)
Pre-visit discussion using our clinic review tool. No additional management support is needed unless otherwise documented below in the visit note.  Diabetes:  Using medications without difficulties: off metformin for months.  Hypoglycemic episodes: no Hyperglycemic episodes: not elevated in AM.  Up into the 200s rarely.  Feet problems: at baseline, numbness in the toes, R>L 1st toes.  Doesn't appear to be shoe related.  Normal exam today.  Blood Sugars averaging: see above eye exam within last year: yes He's working on exercise.  "On 1-10, my diet is a 5.  I've got to do something about it." Work is busy but good.    Meds, vitals, and allergies reviewed.   ROS: See HPI.  Otherwise negative.    GEN: nad, alert and oriented HEENT: mucous membranes moist NECK: supple w/o LA CV: rrr. PULM: ctab, no inc wob ABD: soft, +bs EXT: no edema SKIN: no acute rash  Diabetic foot exam: Normal inspection No skin breakdown No calluses  Normal DP pulses Normal sensation to light touch and monofilament Nails normal

## 2013-12-03 ENCOUNTER — Encounter: Payer: Self-pay | Admitting: *Deleted

## 2014-09-14 IMAGING — US ABDOMEN ULTRASOUND
1 series · 13 of 25 positions shown · non-contrast
Comparison: none

REASON FOR EXAM: Abd Pain
COMMENTS:

[Series 1: abdomen ultrasound · 0.35mm/px · 13 of 79 slices shown]
[im 1/79]
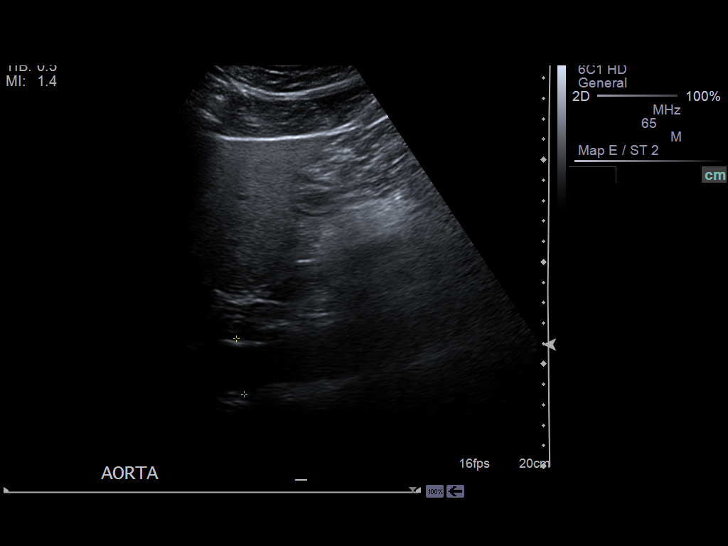
[im 7/79]
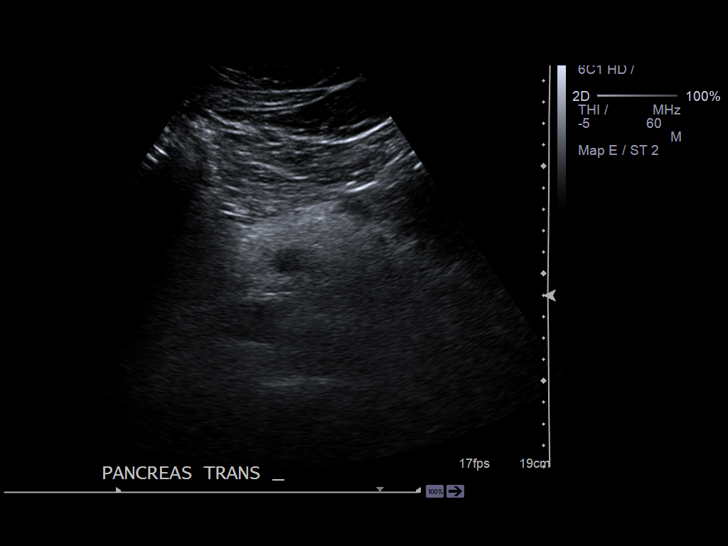
[im 14/79]
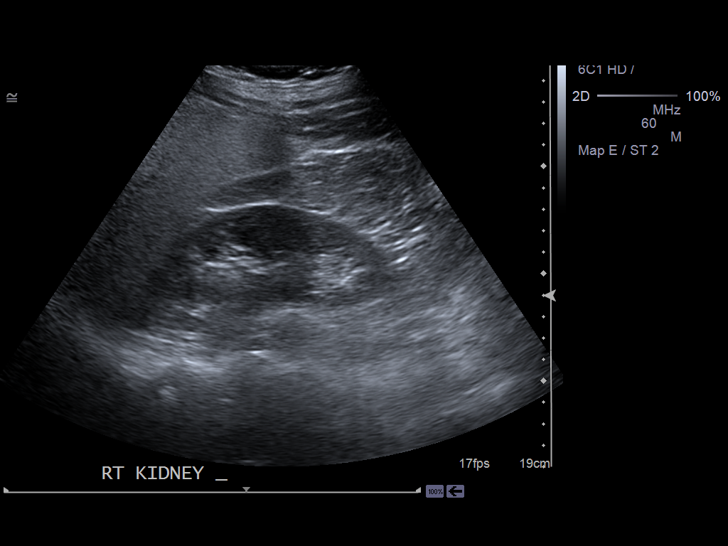
[im 20/79]
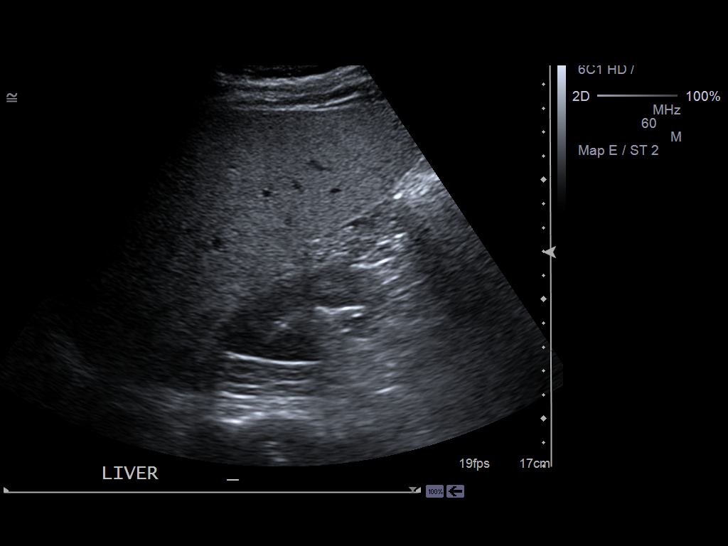
[im 27/79]
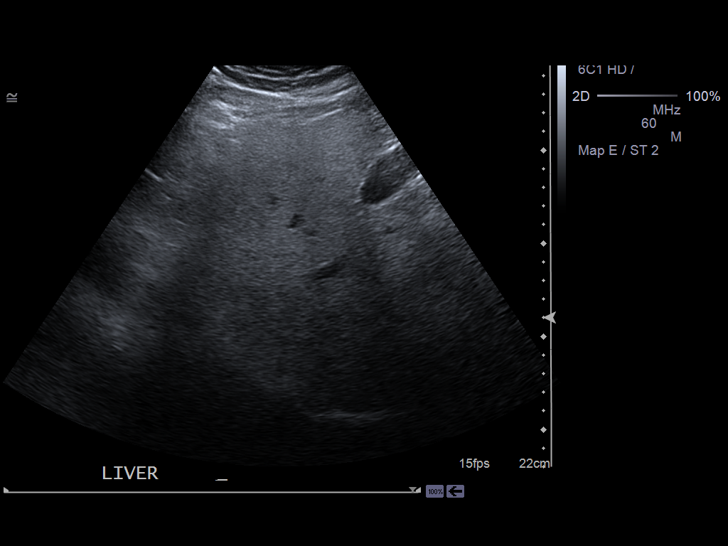
[im 33/79]
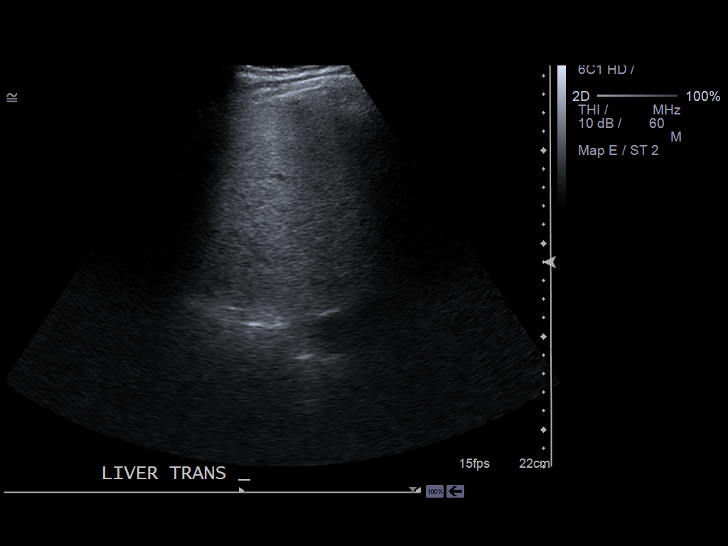
[im 40/79]
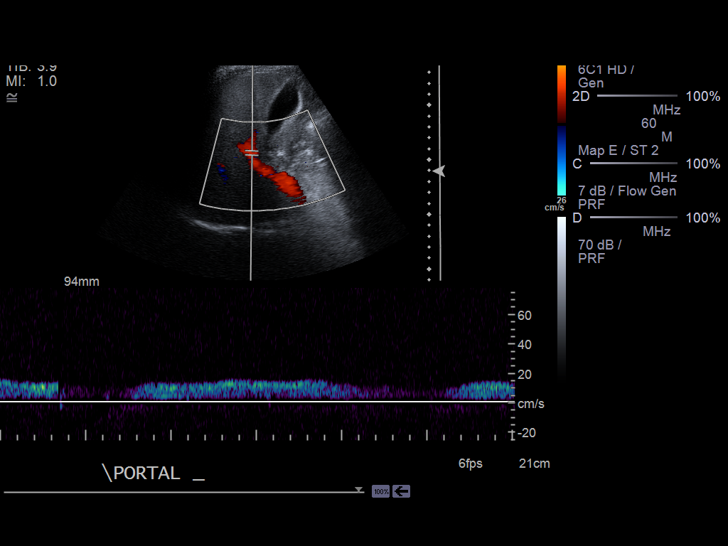
[im 46/79]
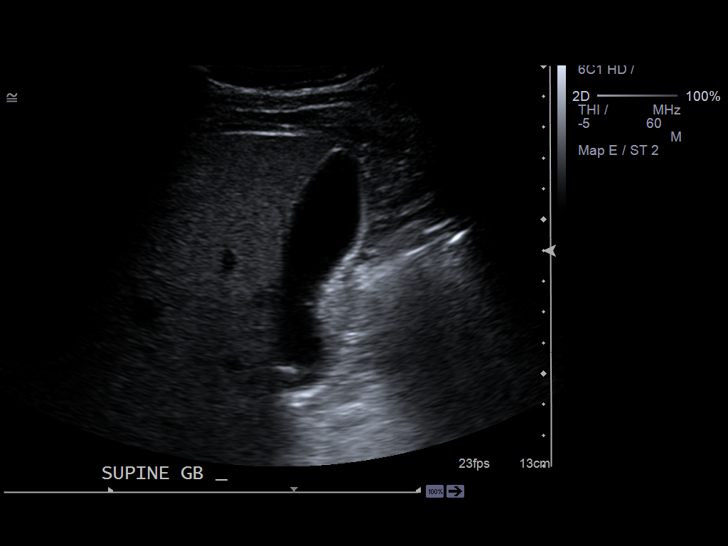
[im 53/79]
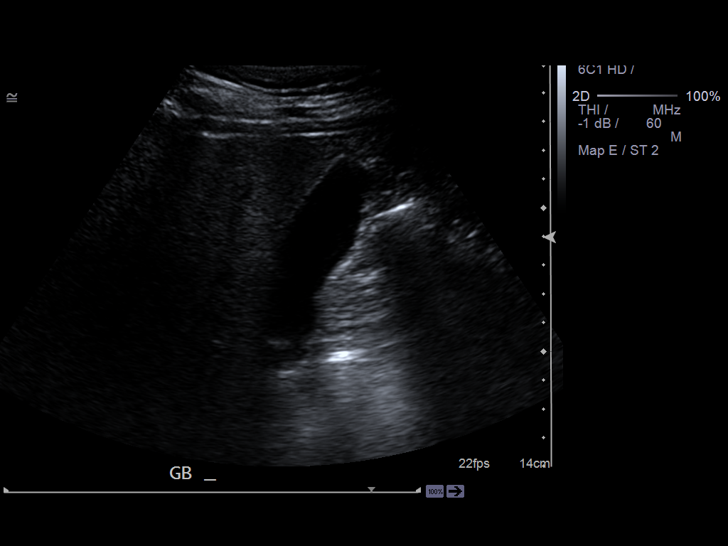
[im 59/79]
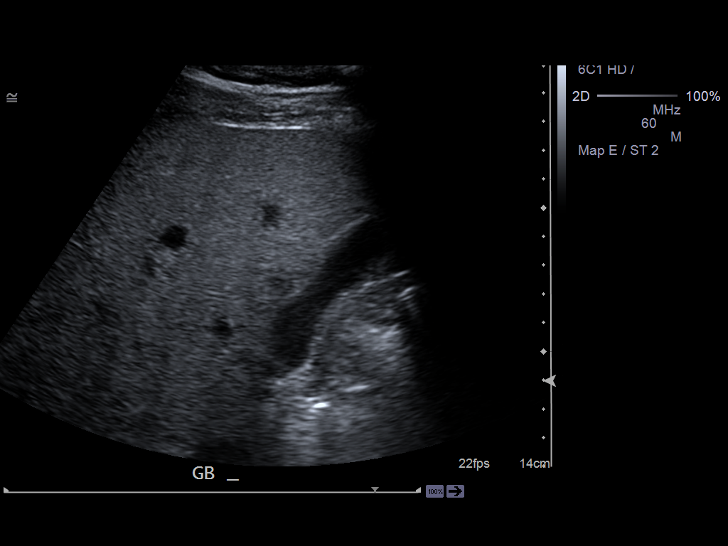
[im 66/79]
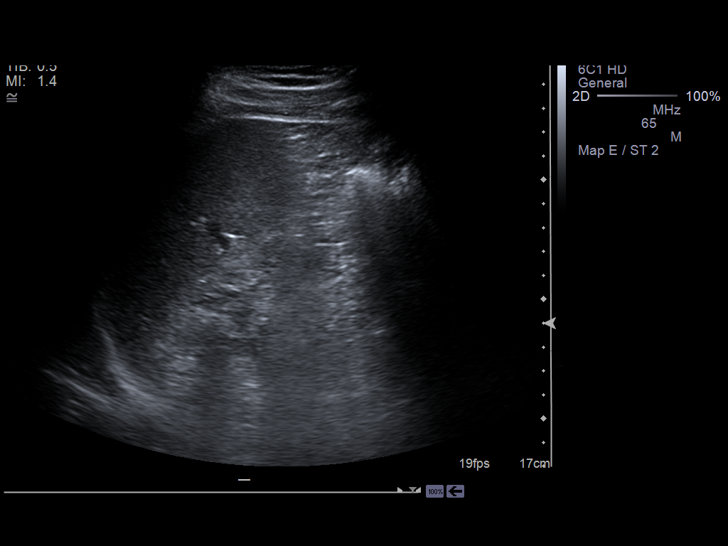
[im 72/79]
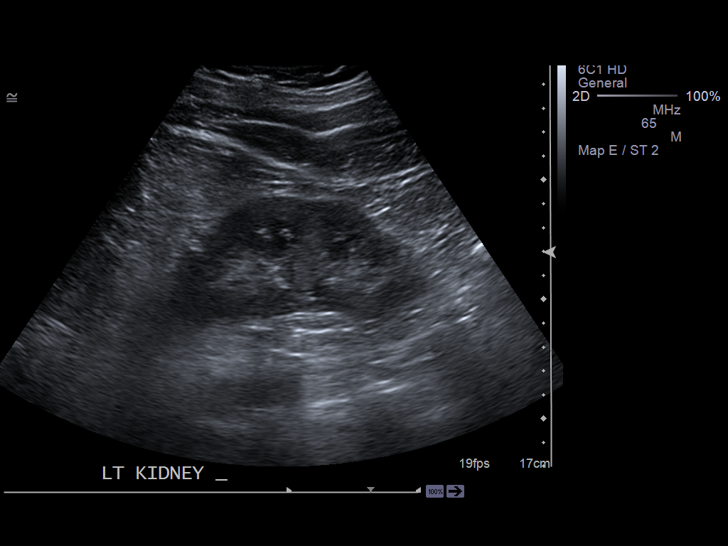
[im 79/79]
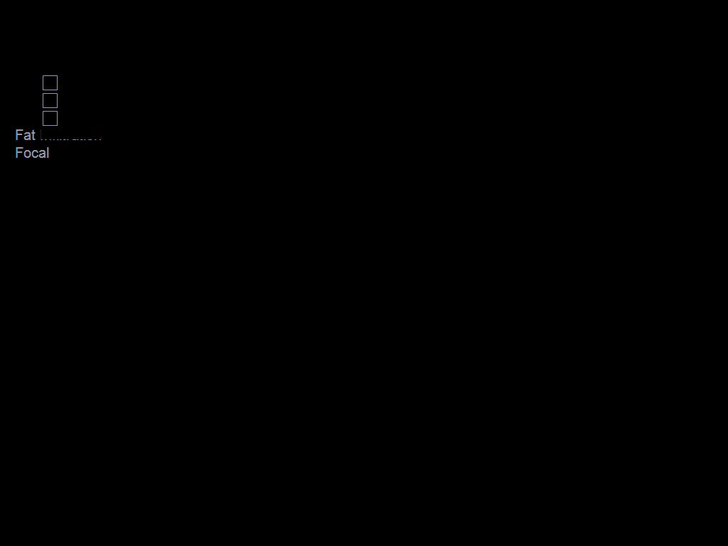

[13 of 25 positions shown; findings below may reference images not displayed]

PROCEDURE:     US  - US ABDOMEN GENERAL SURVEY  - July 22, 2012  [DATE]

RESULT:     Abdominal sonogram is performed in the standard fashion. The
aorta appears to taper distally. The visualized portions of the pancreas
appear grossly normal. Portions are secured. The right kidney measures
x 5.55 x 5.12 cm. There is no hydronephrosis or solid or cystic mass. The
cortical thickness is 1.21 cm. The liver length is 17.34 cm. The hepatic
echotexture is dense consistent with fatty infiltration. There is no intra
or extrahepatic biliary ductal dilation. The portal venous flow is normal.
The common bile duct diameter is 4.3 mm. No gallstones are evident. The
gallbladder wall thickness is 1.9 mm. There is no pericholecystic fluid.
There is a negative sonographic Murphy's sign. The proximal inferior vena
cava is patent. The spleen measures 11.06 cm with normal echotexture
demonstrated. The left kidney measures 11.54 x 5.09 x 6.72 cm with a
cortical thickness of 1.34 cm. There is no evidence of hydronephrosis or
solid or cystic mass. Neither kidney shows nephrolithiasis.
IMPRESSION: 1. Echogenic appearance of the liver consistent with fatty infiltration.
Incomplete visualization of the pancreas.

[REDACTED]

## 2016-06-26 DIAGNOSIS — Z86010 Personal history of colonic polyps: Secondary | ICD-10-CM | POA: Diagnosis not present

## 2016-08-05 ENCOUNTER — Emergency Department: Payer: 59

## 2016-08-05 ENCOUNTER — Emergency Department
Admission: EM | Admit: 2016-08-05 | Discharge: 2016-08-05 | Disposition: A | Discharge status: Home / Self Care | Payer: 59 | Attending: Emergency Medicine | Admitting: Emergency Medicine

## 2016-08-05 ENCOUNTER — Encounter: Payer: Self-pay | Admitting: Emergency Medicine

## 2016-08-05 DIAGNOSIS — E119 Type 2 diabetes mellitus without complications: Secondary | ICD-10-CM | POA: Insufficient documentation

## 2016-08-05 DIAGNOSIS — R911 Solitary pulmonary nodule: Secondary | ICD-10-CM

## 2016-08-05 DIAGNOSIS — F17290 Nicotine dependence, other tobacco product, uncomplicated: Secondary | ICD-10-CM | POA: Diagnosis not present

## 2016-08-05 DIAGNOSIS — Z7984 Long term (current) use of oral hypoglycemic drugs: Secondary | ICD-10-CM | POA: Insufficient documentation

## 2016-08-05 DIAGNOSIS — Z79899 Other long term (current) drug therapy: Secondary | ICD-10-CM | POA: Insufficient documentation

## 2016-08-05 DIAGNOSIS — R918 Other nonspecific abnormal finding of lung field: Secondary | ICD-10-CM | POA: Diagnosis not present

## 2016-08-05 DIAGNOSIS — R05 Cough: Secondary | ICD-10-CM | POA: Diagnosis not present

## 2016-08-05 DIAGNOSIS — R0602 Shortness of breath: Secondary | ICD-10-CM | POA: Diagnosis not present

## 2016-08-05 DIAGNOSIS — R079 Chest pain, unspecified: Secondary | ICD-10-CM | POA: Insufficient documentation

## 2016-08-05 LAB — COMPREHENSIVE METABOLIC PANEL
ALT: 29 U/L (ref 17–63)
AST: 25 U/L (ref 15–41)
Albumin: 4.3 g/dL (ref 3.5–5.0)
Alkaline Phosphatase: 55 U/L (ref 38–126)
Anion gap: 9 (ref 5–15)
BUN: 16 mg/dL (ref 6–20)
CO2: 26 mmol/L (ref 22–32)
CREATININE: 0.89 mg/dL (ref 0.61–1.24)
Calcium: 9 mg/dL (ref 8.9–10.3)
Chloride: 100 mmol/L — ABNORMAL LOW (ref 101–111)
GFR calc non Af Amer: >60 mL/min (ref >60)
Glucose, Bld: 339 mg/dL — ABNORMAL HIGH (ref 65–99)
Potassium: 4.1 mmol/L (ref 3.5–5.1)
Sodium: 135 mmol/L (ref 135–145)
Total Bilirubin: 0.8 mg/dL (ref 0.3–1.2)
Total Protein: 7.5 g/dL (ref 6.5–8.1)

## 2016-08-05 LAB — TROPONIN I

## 2016-08-05 LAB — CBC
HEMATOCRIT: 47.7 % (ref 40.0–52.0)
Hemoglobin: 16.6 g/dL (ref 13.0–18.0)
MCH: 30.6 pg (ref 26.0–34.0)
MCHC: 34.7 g/dL (ref 32.0–36.0)
MCV: 88.2 fL (ref 80.0–100.0)
Platelets: 142 10*3/uL — ABNORMAL LOW (ref 150–440)
RBC: 5.41 MIL/uL (ref 4.40–5.90)
RDW: 12.8 % (ref 11.5–14.5)
WBC: 7.7 10*3/uL (ref 3.8–10.6)

## 2016-08-05 MED ORDER — ASPIRIN 81 MG PO CHEW
324.0000 mg | CHEWABLE_TABLET | Freq: Once | ORAL | Status: AC
  Administered 2016-08-05: 324 mg via ORAL
  Filled 2016-08-05: qty 4

## 2016-08-05 NOTE — Discharge Instructions (Signed)
You have been seen in the Emergency Department (ED) today for chest pain.  As we have discussed today?s test results are normal, but you may require further testing.  We encourage you to call the [place] cardiology clinic.  Explain you were seen in the emergency department for chest pain and need the next available clinic appointment for further follow-up, preferably either Tuesday or Wednesday.  If they do not have a spot available for you in an appropriate amount of time, please try calling [facility] at the other number provided and schedule an appointment with Dr. Humphrey Rolls.  Return to the Emergency Department (ED) immediately if you experience any further chest pain/pressure/tightness, difficulty breathing, or sudden sweating, or other symptoms that concern you.

## 2016-08-05 NOTE — ED Provider Notes (Signed)
[facility] Emergency Department Provider Note  ____________________________________________   First MD Initiated Contact with Patient 08/05/16 1658     (approximate)  I have reviewed the triage vital signs and the nursing notes.   HISTORY  Chief Complaint Chest Pain    HPI Matthew Burns is a 61 y.o. male with a history includes high blood pressure and diabetes who presents for evaluation of episodic chest pain.  This is been happening intermittently for the last few days.  He has not had any nausea or vomiting but reports that the pain is occasionally heavy and dull, not related to exertion, and nothing particular makes it better or worse.  He initially denied being short of breath but his wife states that he does seem to be out of breath when he has the pain.  He took 2 baby aspirin today but does not typically take aspirin.  He has not been ill recently.   Past Medical History:  Diagnosis Date  . BPH (benign prostatic hypertrophy)   . Diabetes mellitus    typeII  . ED (erectile dysfunction)     Patient Active Problem List   Diagnosis Date Noted  . RUQ pain 07/20/2012  . FH: prostate cancer 07/20/2012  . Routine general medical examination at a health care facility 07/20/2012  . ED (erectile dysfunction) 01/16/2012  . Encounter for administrative examinations 03/10/2011  . Diabetes type 2, uncontrolled ([place]) 08/02/2010  . HYPERTROPHY PROSTATE W/UR OBST & OTH LUTS 08/02/2010    Past Surgical History:  Procedure Laterality Date  . EYE SURGERY  2004   Lasik  . planter fascitis surgery  1991    Prior to Admission medications   Medication Sig Start Date End Date Taking? Authorizing Provider  INVOKAMET 50-500 MG TABS Take 1 tablet by mouth 2 (two) times daily. 07/05/16  Yes Historical Provider, MD  lisinopril (PRINIVIL,ZESTRIL) 5 MG tablet Take 5 mg by mouth daily. 07/31/16  Yes Historical Provider, MD  Lancets MISC Use as directed 01/21/11    Tonia Ghent, MD  metFORMIN (GLUCOPHAGE) 1000 MG tablet Take 1 tablet (1,000 mg total) by mouth daily. 05/04/13   Tonia Ghent, MD    Allergies Metformin and related and Viagra [sildenafil citrate]  Family History  Problem Relation Age of Onset  . Diabetes Mother     glaucoma  . Heart disease Father     CAD s/p stent//glaucoma  . Prostate cancer Father     local radiation, no chemo  . Diabetes Maternal Grandmother   . Diabetes Maternal Grandfather   . Diabetes Paternal Grandmother   . Diabetes Paternal Grandfather   . Colon cancer Neg Hx     Social History Social History  Substance Use Topics  . Smoking status: Current Some Day Smoker    Types: Cigars    Last attempt to quit: 08/09/2010  . Smokeless tobacco: Never Used  . Alcohol use No    Review of Systems Constitutional: No fever/chills Eyes: No visual changes. ENT: No sore throat. Cardiovascular: Episodic chest pain for several days Respiratory: mild shortness of breath occasional with the chest pain Gastrointestinal: No abdominal pain.  No nausea, no vomiting.  No diarrhea.  No constipation. Genitourinary: Negative for dysuria. Musculoskeletal: Negative for back pain. Skin: Negative for rash. Neurological: Negative for headaches, focal weakness or numbness.  10-point ROS otherwise negative.  ____________________________________________   PHYSICAL EXAM:  VITAL SIGNS: ED Triage Vitals  Enc Vitals Group     BP  08/05/16 1342 (!) 165/85     Pulse Rate 08/05/16 1342 (!) 110     Resp 08/05/16 1342 20     Temp 08/05/16 1342 98 F (36.7 C)     Temp Source 08/05/16 1342 Oral     SpO2 08/05/16 1342 98 %     Weight 08/05/16 1344 225 lb (102.1 kg)     Height 08/05/16 1344 6' (1.829 m)     Head Circumference --      Peak Flow --      Pain Score 08/05/16 1344 2     Pain Loc --      Pain Edu? --      Excl. in [place]? --     Constitutional: Alert and oriented. Well appearing and in no acute distress. Eyes:  Conjunctivae are normal. PERRL. EOMI. Head: Atraumatic. Nose: No congestion/rhinnorhea. Mouth/Throat: Mucous membranes are moist. Neck: No stridor.  No meningeal signs.   Cardiovascular: Normal rate, regular rhythm. Good peripheral circulation. Grossly normal heart sounds. Respiratory: Normal respiratory effort.  No retractions. Lungs CTAB. Gastrointestinal: Soft and nontender. No distention.  Musculoskeletal: No lower extremity tenderness nor edema. No gross deformities of extremities. Neurologic:  Normal speech and language. No gross focal neurologic deficits are appreciated.  Skin:  Skin is warm, dry and intact. No rash noted. Psychiatric: Mood and affect are normal. Speech and behavior are normal.  ____________________________________________   LABS (all labs ordered are listed, but only abnormal results are displayed)  Labs Reviewed  CBC - Abnormal; Notable for the following:       Result Value   Platelets 142 (*)    All other components within normal limits  COMPREHENSIVE METABOLIC PANEL - Abnormal; Notable for the following:    Chloride 100 (*)    Glucose, Bld 339 (*)    All other components within normal limits  TROPONIN I  TROPONIN I   ____________________________________________  EKG  ED ECG REPORT I, Yarenis Cerino, the attending physician, personally viewed and interpreted this ECG.  Date: 08/05/2016 EKG Time: 13:40 Rate: 111 Rhythm: sinus tachycardia QRS Axis: normal Intervals: normal ST/T Wave abnormalities: Non-specific ST segment / T-wave changes, but no evidence of acute ischemia. Conduction Disturbances: none Narrative Interpretation: unremarkable  ____________________________________________  RADIOLOGY   Dg Chest 2 View  Result Date: 08/05/2016 CLINICAL DATA:  Patient reports chest congestion and non-productive cough x3 months. Hx HTN, DM. Current smoker. EXAM: CHEST  2 VIEW COMPARISON:  None. FINDINGS: Normal mediastinum and cardiac silhouette.  Normal pulmonary vasculature. Small dense nodule in the RIGHT lung measuring 4 mm is likely a benign calcified granuloma. No evidence of effusion, infiltrate, or pneumothorax. No acute bony abnormality. Degenerative osteophytosis of the lower thoracic spine. IMPRESSION: No active cardiopulmonary disease. Probable calcified pulmonary nodule. Consider follow-up chest radiograph in 6 to 12 months if patient has smoking history. Electronically Signed   By: Suzy Bouchard M.D.   On: 08/05/2016 14:06    ____________________________________________   PROCEDURES  Procedure(s) performed:   Procedures   Critical Care performed: No ____________________________________________   INITIAL IMPRESSION / ASSESSMENT AND PLAN / ED COURSE  Pertinent labs & imaging results that were available during my care of the patient were reviewed by me and considered in my medical decision making (see chart for details).   Clinical Course as of Aug 05 2000  Mon Aug 05, 2016  1717 HEART score 6 (moderate risk).  Patient has numerous risk factors for heart disease in his father is had 3  MIs, the first of which occurred when his father was in his mid 91s, and he had an onset who had a massive fatal heart attack at age 87.  The patient reports that he had several episodes of pain over the last 6 months but has been having daily pain over the last 3 days and it is occurred several times at rest over the last 2 days.  This may represent ischemic heart disease.  I had a long conversation with the patient and his wife about inpatient evaluation versus outpatient follow-up.  At this point they are comfortable with the second troponin and outpatient follow-up.  I will reassess after the second troponin is back  [CF]  1950 The patient's second troponin is negative.  I had another long conversation with him and his wife.  I explained the risks and benefits of chest pain observation and further evaluation in the hospital versus  close outpatient follow-up.  They are comfortable at this time with plan for outpatient follow-up and I will give them the name and number of 2 different local cardiology offices so that they can be sure to have an appointment scheduled within the next couple of days.  Although the patient falls into the moderate risk category he has been chest pain-free in the emergency department, has 2 negative troponins, and has a low risk of having an acute event over the.  For short-term follow-up.  I gave strict return precautions and a full dose aspirin while in the emergency department.  He and his wife are comfortable with the plan for follow-up and will return should his chest pain worsen.  [CF]  1955 I also updated the patient about the pulmonary nodule on his chest x-ray and encouraged him to follow up in 6-12 months for repeat imaging to make sure it was not getting bigger.  [CF]    Clinical Course User Index [CF] Hinda Kehr, MD    ____________________________________________  FINAL CLINICAL IMPRESSION(S) / ED DIAGNOSES  Final diagnoses:  Abnormal x-ray of lungs with single pulmonary nodule  Chest pain, unspecified type     MEDICATIONS GIVEN DURING THIS VISIT:  Medications  aspirin chewable tablet 324 mg (324 mg Oral Given 08/05/16 1900)     NEW OUTPATIENT MEDICATIONS STARTED DURING THIS VISIT:  New Prescriptions   No medications on file    Modified Medications   No medications on file    Discontinued Medications   No medications on file     Note:  This document was prepared using Dragon voice recognition software and may include unintentional dictation errors.    Hinda Kehr, MD 08/05/16 2002

## 2016-08-05 NOTE — ED Triage Notes (Signed)
Chest pain x 3 days intermittent. Denies diaphoresis, positive SOB. States chest is tender to palpation.

## 2016-08-06 ENCOUNTER — Ambulatory Visit (INDEPENDENT_AMBULATORY_CARE_PROVIDER_SITE_OTHER): Payer: 59 | Admitting: Cardiology

## 2016-08-06 ENCOUNTER — Encounter: Payer: Self-pay | Admitting: Cardiology

## 2016-08-06 VITALS — BP 124/82 | HR 97 | Ht 72.0 in | Wt 225.5 lb

## 2016-08-06 DIAGNOSIS — R079 Chest pain, unspecified: Secondary | ICD-10-CM | POA: Diagnosis not present

## 2016-08-06 DIAGNOSIS — F17200 Nicotine dependence, unspecified, uncomplicated: Secondary | ICD-10-CM

## 2016-08-06 DIAGNOSIS — I10 Essential (primary) hypertension: Secondary | ICD-10-CM

## 2016-08-06 MED ORDER — ISOSORBIDE MONONITRATE ER 30 MG PO TB24: 30.0000 mg | ORAL_TABLET | Freq: Every day | ORAL | 6 refills | Status: DC

## 2016-08-06 MED ORDER — NITROGLYCERIN 0.4 MG SL SUBL: 0.4000 mg | SUBLINGUAL_TABLET | SUBLINGUAL | 2 refills | Status: DC | PRN

## 2016-08-06 NOTE — Progress Notes (Signed)
Cardiology Office Note   Date:  08/06/2016   ID:  Matthew Burns, DOB July 09, 1955, MRN [id number]  Referring Doctor:  Marinda Elk, MD   Cardiologist:   Wende Bushy, MD   Reason for consultation:  Chief Complaint  Patient presents with  . other    NP. ED f/u for chest pain. Pt has upcoming surgery May 11th for colonoscopy. Reviewed meds with pt verbally.      History of Present Illness: Matthew Burns is a 61 y.o. male who presents for Chest pain.  Patient reports onset probably 6 months now. Since 4 days ago, he has had almost daily episodes of chest pain. The worst was Friday, 4 days ago. He has a throbbing kind of pain on the left side of the chest longest duration was 1-2 hours. Usually 2-3 out of 10 severity, at the most 5-6 out of 10. Not exertional in nature. Nonradiating. He was in the ER yesterday and was ruled out for ACS.  Patient denies shortness of breath, palpitations, orthopnea, PND, edema.   ROS:  Please see the history of present illness. Aside from mentioned under HPI, all other systems are reviewed and negative.     Past Medical History:  Diagnosis Date  . BPH (benign prostatic hypertrophy)   . Diabetes mellitus    typeII  . ED (erectile dysfunction)     Past Surgical History:  Procedure Laterality Date  . EYE SURGERY  2004   Lasik  . planter fascitis surgery  1991     reports that he has been smoking Cigars.  He has never used smokeless tobacco. He reports that he does not drink alcohol or use drugs.   family history includes Diabetes in his maternal grandfather, maternal grandmother, mother, paternal grandfather, and paternal grandmother; Glaucoma in his father and mother; Heart attack in his father; Heart disease in his father; Prostate cancer in his father.   Outpatient Medications Prior to Visit  Medication Sig Dispense Refill  . INVOKAMET 50-500 MG TABS Take 1 tablet by mouth 2 (two) times daily.  5  . Lancets MISC Use  as directed 100 each 3  . lisinopril (PRINIVIL,ZESTRIL) 5 MG tablet Take 5 mg by mouth daily.    . metFORMIN (GLUCOPHAGE) 1000 MG tablet Take 1 tablet (1,000 mg total) by mouth daily. (Patient not taking: Reported on 08/06/2016) 90 tablet 3   No facility-administered medications prior to visit.      Allergies: Metformin and related and Viagra [sildenafil citrate]    PHYSICAL EXAM: VS:  BP 124/82 (BP Location: Right Arm, Patient Position: Sitting, Cuff Size: Normal)   Pulse 97   Ht 6' (1.829 m)   Wt 225 lb 8 oz (102.3 kg)   BMI 30.58 kg/m  , Body mass index is 30.58 kg/m. Wt Readings from Last 3 Encounters:  08/06/16 225 lb 8 oz (102.3 kg)  08/05/16 225 lb (102.1 kg)  05/04/13 227 lb (103 kg)    GENERAL:  well developed, well nourished, obese, not in acute distress HEENT: normocephalic, pink conjunctivae, anicteric sclerae, no xanthelasma, normal dentition, oropharynx clear NECK:  no neck vein engorgement, JVP normal, no hepatojugular reflux, carotid upstroke brisk and symmetric, no bruit, no thyromegaly, no lymphadenopathy LUNGS:  good respiratory effort, clear to auscultation bilaterally CV:  PMI not displaced, no thrills, no lifts, S1 and S2 within normal limits, no palpable S3 or S4, no murmurs, no rubs, no gallops ABD:  Soft, nontender, nondistended, normoactive bowel sounds,  no abdominal aortic bruit, no hepatomegaly, no splenomegaly MS: nontender back, no kyphosis, no scoliosis, no joint deformities EXT:  2+ DP/PT pulses, no edema, no varicosities, no cyanosis, no clubbing SKIN: warm, nondiaphoretic, normal turgor, no ulcers NEUROPSYCH: alert, oriented to person, place, and time, sensory/motor grossly intact, normal mood, appropriate affect  Recent Labs: 08/05/2016: ALT 29; BUN 16; Creatinine, Ser 0.89; Hemoglobin 16.6; Platelets 142; Potassium 4.1; Sodium 135   Lipid Panel    Component Value Date/Time   CHOL 126 07/13/2012 0750   TRIG 173.0 (H) 07/13/2012 0750   HDL  27.40 (L) 07/13/2012 0750   CHOLHDL 5 07/13/2012 0750   VLDL 34.6 07/13/2012 0750   LDLCALC 64 07/13/2012 0750     Other studies Reviewed:  EKG:  The ekg from 08/06/2016 was personally reviewed by me and it revealed sinus rhythm, 97 BPM  Additional studies/ records that were reviewed personally reviewed by me today include: None available   ASSESSMENT AND PLAN: Chest pain Risk factors for CAD include age, gender, hypertension, diabetes, smoking history, family history of CAD Recommend exercise nuclear stress test, converted to pharmacologic effect unable to reach target heart rate. Recommend echocardiogram. Trial with Imdur 30 mg by mouth daily. Patient advised not to take any medications for erectile dysfunction. Patient prescribed ASA, and NTG SL prn for chest pain. Patient instructed to call 911 for unrelenting chest pain.  HTN BP is well controlled. Continue monitoring BP. Continue current medical therapy and lifestyle changes.  Tobacco use We discussed the importance of smoking cessation and different strategies for quitting.      Current medicines are reviewed at length with the patient today.  The patient does not have concerns regarding medicines.  Labs/ tests ordered today include:  Orders Placed This Encounter  Procedures  . NM Myocar Multi W/Spect W/Wall Motion / EF  . EKG 12-Lead  . ECHOCARDIOGRAM COMPLETE    I had a lengthy and detailed discussion with the patient regarding diagnoses, prognosis, diagnostic options, treatment options , and side effects of medications.   I counseled the patient on importance of lifestyle modification including heart healthy diet, regular physical activity Once cardiac workup is completed , and smoking cessation.   Disposition:   FU with Cardiology after tests   Thank you for this consultation. We will forwarding this consultation to referring physician.   Signed, Wende Bushy, MD  08/06/2016 4:57 PM    [place]  This note was generated in part with voice recognition software and I apologize for any typographical errors that were not detected and corrected.

## 2016-08-06 NOTE — Patient Instructions (Addendum)
Medication Instructions:  Your physician has recommended you make the following change in your medication:  1. START Isosorbide mononitrate 30 mg once daily 2. Nitroglycerin 0.4 mg under the tongue for chest pain. If a single episode of chest pain is not relieved by one tablet, the patient will try another within 5 minutes; and if this doesn't relieve the pain, the patient is instructed to call 911 for transportation to an emergency department. READ instructions below.     Testing/Procedures: Your physician has requested that you have an echocardiogram. Echocardiography is a painless test that uses sound waves to create images of your heart. It provides your doctor with information about the size and shape of your heart and how well your heart's chambers and valves are working. This procedure takes approximately one hour. There are no restrictions for this procedure.  [place]  Your caregiver has ordered a Stress Test with nuclear imaging. The purpose of this test is to evaluate the blood supply to your heart muscle. This procedure is referred to as a "Non-Invasive Stress Test." This is because other than having an IV started in your vein, nothing is inserted or "invades" your body. Cardiac stress tests are done to find areas of poor blood flow to the heart by determining the extent of coronary artery disease (CAD).   Please note: these test may take anywhere between 2-4 hours to complete  PLEASE REPORT TO [place] AT THE FIRST DESK WILL DIRECT YOU WHERE TO GO  Date of Procedure:_Tuesday August 13, 2016 at 65:30AM_  Arrival Time for Procedure:_Arrive at 07:15AM to register__  Instructions regarding medication:   __X__ : Hold diabetes medication morning of procedure    PLEASE NOTIFY THE OFFICE AT LEAST 24 HOURS IN ADVANCE IF YOU ARE UNABLE TO KEEP YOUR APPOINTMENT.  [phone] AND  PLEASE NOTIFY NUCLEAR MEDICINE AT [facility] AT LEAST 24 HOURS IN  ADVANCE IF YOU ARE UNABLE TO KEEP YOUR APPOINTMENT. [phone]  How to prepare for your Myoview test:  1. Do not eat or drink after midnight 2. No caffeine for 24 hours prior to test 3. No smoking 24 hours prior to test. 4. Your medication may be taken with water.  If your doctor stopped a medication because of this test, do not take that medication. 5. Ladies, please do not wear dresses.  Skirts or pants are appropriate. Please wear a short sleeve shirt. 6. No perfume, cologne or lotion. 7. Wear comfortable walking shoes. No heels!    Follow-Up: Your physician recommends that you schedule a follow-up appointment as needed. We will call you with results and if needed schedule at that time.   It was a pleasure seeing you today here in the office. Please do not hesitate to give [place] a call back if you have any further questions. [place], BSN    Echocardiogram An echocardiogram, or echocardiography, uses sound waves (ultrasound) to produce an image of your heart. The echocardiogram is simple, painless, obtained within a short period of time, and offers valuable information to your health care provider. The images from an echocardiogram can provide information such as:  Evidence of coronary artery disease (CAD).  Heart size.  Heart muscle function.  Heart valve function.  Aneurysm detection.  Evidence of a past heart attack.  Fluid buildup around the heart.  Heart muscle thickening.  Assess heart valve function. Tell a health care provider about:  Any allergies you have.  All medicines  you are taking, including vitamins, herbs, eye drops, creams, and over-the-counter medicines.  Any problems you or family members have had with anesthetic medicines.  Any blood disorders you have.  Any surgeries you have had.  Any medical conditions you have.  Whether you are pregnant or may be pregnant. What happens before the procedure? No special preparation  is needed. Eat and drink normally. What happens during the procedure?  In order to produce an image of your heart, gel will be applied to your chest and a wand-like tool (transducer) will be moved over your chest. The gel will help transmit the sound waves from the transducer. The sound waves will harmlessly bounce off your heart to allow the heart images to be captured in real-time motion. These images will then be recorded.  You may need an IV to receive a medicine that improves the quality of the pictures. What happens after the procedure? You may return to your normal schedule including diet, activities, and medicines, unless your health care provider tells you otherwise. This information is not intended to replace advice given to you by your health care provider. Make sure you discuss any questions you have with your health care provider. Document Released: 05/10/2000 Document Revised: 12/30/2015 Document Reviewed: 01/18/2013 Elsevier Interactive Patient Education  2017 [place]. Cardiac Nuclear Scan A cardiac nuclear scan is a test that measures blood flow to the heart when a person is resting and when he or she is exercising. The test looks for problems such as:  Not enough blood reaching a portion of the heart.  The heart muscle not working normally. You may need this test if:  You have heart disease.  You have had abnormal lab results.  You have had heart surgery or angioplasty.  You have chest pain.  You have shortness of breath. In this test, a radioactive dye (tracer) is injected into your bloodstream. After the tracer has traveled to your heart, an imaging device is used to measure how much of the tracer is absorbed by or distributed to various areas of your heart. This procedure is usually done at a hospital and takes 2-4 hours. Tell a health care provider about:  Any allergies you have.  All medicines you are taking, including vitamins, herbs, eye drops, creams,  and over-the-counter medicines.  Any problems you or family members have had with the use of anesthetic medicines.  Any blood disorders you have.  Any surgeries you have had.  Any medical conditions you have.  Whether you are pregnant or may be pregnant. What are the risks? Generally, this is a safe procedure. However, problems may occur, including:  Serious chest pain and heart attack. This is only a risk if the stress portion of the test is done.  Rapid heartbeat.  Sensation of warmth in your chest. This usually passes quickly. What happens before the procedure?  Ask your health care provider about changing or stopping your regular medicines. This is especially important if you are taking diabetes medicines or blood thinners.  Remove your jewelry on the day of the procedure. What happens during the procedure?  An IV tube will be inserted into one of your veins.  Your health care provider will inject a small amount of radioactive tracer through the tube.  You will wait for 20-40 minutes while the tracer travels through your bloodstream.  Your heart activity will be monitored with an electrocardiogram (ECG).  You will lie down on an exam table.  Images of your  heart will be taken for about 15-20 minutes.  You may be asked to exercise on a treadmill or stationary bike. While you exercise, your heart's activity will be monitored with an ECG, and your blood pressure will be checked. If you are unable to exercise, you may be given a medicine to increase blood flow to parts of your heart.  When blood flow to your heart has peaked, a tracer will again be injected through the IV tube.  After 20-40 minutes, you will get back on the exam table and have more images taken of your heart.  When the procedure is over, your IV tube will be removed. The procedure may vary among health care providers and hospitals. Depending on the type of tracer used, scans may need to be repeated 3-4  hours later. What happens after the procedure?  Unless your health care provider tells you otherwise, you may return to your normal schedule, including diet, activities, and medicines.  Unless your health care provider tells you otherwise, you may increase your fluid intake. This will help flush the contrast dye from your body. Drink enough fluid to keep your urine clear or pale yellow.  It is up to you to get your test results. Ask your health care provider, or the department that is doing the test, when your results will be ready. Summary  A cardiac nuclear scan measures the blood flow to the heart when a person is resting and when he or she is exercising.  You may need this test if you are at risk for heart disease.  Tell your health care provider if you are pregnant.  Unless your health care provider tells you otherwise, increase your fluid intake. This will help flush the contrast dye from your body. Drink enough fluid to keep your urine clear or pale yellow. This information is not intended to replace advice given to you by your health care provider. Make sure you discuss any questions you have with your health care provider. Document Released: 06/07/2004 Document Revised: 05/15/2016 Document Reviewed: 04/21/2013 Elsevier Interactive Patient Education  2017 [place]. Nitroglycerin sublingual tablets What is this medicine? NITROGLYCERIN (nye troe GLI ser in) is a type of vasodilator. It relaxes blood vessels, increasing the blood and oxygen supply to your heart. This medicine is used to relieve chest pain caused by angina. It is also used to prevent chest pain before activities like climbing stairs, going outdoors in cold weather, or sexual activity. This medicine may be used for other purposes; ask your health care provider or pharmacist if you have questions. COMMON BRAND NAME(S): Nitroquick, Nitrostat, Nitrotab What should I tell my health care provider before I take this  medicine? They need to know if you have any of these conditions: -anemia -head injury, recent stroke, or bleeding in the brain -liver disease -previous heart attack -an unusual or allergic reaction to nitroglycerin, other medicines, foods, dyes, or preservatives -pregnant or trying to get pregnant -breast-feeding How should I use this medicine? Take this medicine by mouth as needed. At the first sign of an angina attack (chest pain or tightness) place one tablet under your tongue. You can also take this medicine 5 to 10 minutes before an event likely to produce chest pain. Follow the directions on the prescription label. Let the tablet dissolve under the tongue. Do not swallow whole. Replace the dose if you accidentally swallow it. It will help if your mouth is not dry. Saliva around the tablet will help it to  dissolve more quickly. Do not eat or drink, smoke or chew tobacco while a tablet is dissolving. If you are not better within 5 minutes after taking ONE dose of nitroglycerin, call 9-1-1 immediately to seek emergency medical care. Do not take more than 3 nitroglycerin tablets over 15 minutes. If you take this medicine often to relieve symptoms of angina, your doctor or health care professional may provide you with different instructions to manage your symptoms. If symptoms do not go away after following these instructions, it is important to call 9-1-1 immediately. Do not take more than 3 nitroglycerin tablets over 15 minutes. Talk to your pediatrician regarding the use of this medicine in children. Special care may be needed. Overdosage: If you think you have taken too much of this medicine contact a poison control center or emergency room at once. NOTE: This medicine is only for you. Do not share this medicine with others. What if I miss a dose? This does not apply. This medicine is only used as needed. What may interact with this medicine? Do not take this medicine with any of the following  medications: -certain migraine medicines like ergotamine and dihydroergotamine (DHE) -medicines used to treat erectile dysfunction like sildenafil, tadalafil, and vardenafil -riociguat This medicine may also interact with the following medications: -alteplase -aspirin -heparin -medicines for high blood pressure -medicines for mental depression -other medicines used to treat angina -phenothiazines like chlorpromazine, mesoridazine, prochlorperazine, thioridazine This list may not describe all possible interactions. Give your health care provider a list of all the medicines, herbs, non-prescription drugs, or dietary supplements you use. Also tell them if you smoke, drink alcohol, or use illegal drugs. Some items may interact with your medicine. What should I watch for while using this medicine? Tell your doctor or health care professional if you feel your medicine is no longer working. Keep this medicine with you at all times. Sit or lie down when you take your medicine to prevent falling if you feel dizzy or faint after using it. Try to remain calm. This will help you to feel better faster. If you feel dizzy, take several deep breaths and lie down with your feet propped up, or bend forward with your head resting between your knees. You may get drowsy or dizzy. Do not drive, use machinery, or do anything that needs mental alertness until you know how this drug affects you. Do not stand or sit up quickly, especially if you are an older patient. This reduces the risk of dizzy or fainting spells. Alcohol can make you more drowsy and dizzy. Avoid alcoholic drinks. Do not treat yourself for coughs, colds, or pain while you are taking this medicine without asking your doctor or health care professional for advice. Some ingredients may increase your blood pressure. What side effects may I notice from receiving this medicine? Side effects that you should report to your doctor or health care professional as  soon as possible: -blurred vision -dry mouth -skin rash -sweating -the feeling of extreme pressure in the head -unusually weak or tired Side effects that usually do not require medical attention (report to your doctor or health care professional if they continue or are bothersome): -flushing of the face or neck -headache -irregular heartbeat, palpitations -nausea, vomiting This list may not describe all possible side effects. Call your doctor for medical advice about side effects. You may report side effects to FDA at 1-800-FDA-1088. Where should I keep my medicine? Keep out of the reach of children. Store at  room temperature between 20 and 25 degrees C (68 and 77 degrees F). Store in [employer]. Protect from light and moisture. Keep tightly closed. Throw away any unused medicine after the expiration date. NOTE: This sheet is a summary. It may not cover all possible information. If you have questions about this medicine, talk to your doctor, pharmacist, or health care provider.  2018 Elsevier/Gold Standard (2013-03-11 17:57:36)

## 2016-08-13 ENCOUNTER — Encounter
Admission: RE | Admit: 2016-08-13 | Discharge: 2016-08-13 | Disposition: A | Discharge status: Home / Self Care | Payer: 59 | Source: Ambulatory Visit | Attending: Cardiology | Admitting: Cardiology

## 2016-08-13 DIAGNOSIS — R079 Chest pain, unspecified: Secondary | ICD-10-CM | POA: Insufficient documentation

## 2016-08-13 LAB — NM MYOCAR MULTI W/SPECT W/WALL MOTION / EF
CHL CUP STRESS STAGE 2 HR: 94 {beats}/min
CHL CUP STRESS STAGE 2 SPEED: 0 mph
CHL CUP STRESS STAGE 3 GRADE: 10 %
CHL CUP STRESS STAGE 3 HR: 116 {beats}/min
CHL CUP STRESS STAGE 3 SBP: 172 mmHg
CHL CUP STRESS STAGE 3 SPEED: 1.7 mph
CHL CUP STRESS STAGE 4 DBP: 67 mmHg
CHL CUP STRESS STAGE 4 HR: 137 {beats}/min
CHL CUP STRESS STAGE 5 DBP: 67 mmHg
CHL CUP STRESS STAGE 5 SBP: 196 mmHg
CHL CUP STRESS STAGE 7 GRADE: 0 %
CHL CUP STRESS STAGE 7 HR: 108 {beats}/min
CHL CUP STRESS STAGE 7 SBP: 185 mmHg
CHL CUP STRESS STAGE 7 SPEED: 0 mph
Estimated workload: 9.9 METS
Exercise duration (min): 8 min
Exercise duration (sec): 40 s
LVDIAVOL: 57 mL (ref 62–150)
LVSYSVOL: 24 mL
Peak HR: 155 {beats}/min
Percent HR: 96 %
Percent of predicted max HR: 96 %
Rest HR: 82 {beats}/min
Stage 1 Grade: 0 %
Stage 1 HR: 94 {beats}/min
Stage 1 Speed: 0 mph
Stage 2 Grade: 0 %
Stage 3 DBP: 72 mmHg
Stage 4 Grade: 12 %
Stage 4 SBP: 194 mmHg
Stage 4 Speed: 2.5 mph
Stage 5 Grade: 14 %
Stage 5 HR: 155 {beats}/min
Stage 5 Speed: 3.3 mph
Stage 6 Grade: 0 %
Stage 6 HR: 131 {beats}/min
Stage 6 Speed: 0 mph
Stage 7 DBP: 74 mmHg
TID: 0.88

## 2016-08-13 MED ORDER — TECHNETIUM TC 99M TETROFOSMIN IV KIT
31.6860 | PACK | Freq: Once | INTRAVENOUS | Status: AC | PRN
  Administered 2016-08-13: 31.686 via INTRAVENOUS

## 2016-08-13 MED ORDER — TECHNETIUM TC 99M TETROFOSMIN IV KIT
13.0000 | PACK | Freq: Once | INTRAVENOUS | Status: AC | PRN
  Administered 2016-08-13: 13.9 via INTRAVENOUS

## 2016-08-23 ENCOUNTER — Other Ambulatory Visit: Payer: 59

## 2016-09-03 ENCOUNTER — Other Ambulatory Visit: Payer: Self-pay

## 2016-09-03 ENCOUNTER — Ambulatory Visit (INDEPENDENT_AMBULATORY_CARE_PROVIDER_SITE_OTHER): Payer: 59

## 2016-09-03 DIAGNOSIS — R079 Chest pain, unspecified: Secondary | ICD-10-CM

## 2016-09-05 ENCOUNTER — Telehealth: Payer: Self-pay | Admitting: Cardiology

## 2016-09-05 DIAGNOSIS — Z1159 Encounter for screening for other viral diseases: Secondary | ICD-10-CM | POA: Diagnosis not present

## 2016-09-05 DIAGNOSIS — E538 Deficiency of other specified B group vitamins: Secondary | ICD-10-CM | POA: Diagnosis not present

## 2016-09-05 DIAGNOSIS — E1165 Type 2 diabetes mellitus with hyperglycemia: Secondary | ICD-10-CM | POA: Diagnosis not present

## 2016-09-05 DIAGNOSIS — R202 Paresthesia of skin: Secondary | ICD-10-CM | POA: Diagnosis not present

## 2016-09-05 DIAGNOSIS — I10 Essential (primary) hypertension: Secondary | ICD-10-CM | POA: Diagnosis not present

## 2016-09-05 NOTE — Telephone Encounter (Signed)
Pt wife would like echo results. Please call.

## 2016-09-05 NOTE — Telephone Encounter (Signed)
Spoke with patients wife and reviewed results. See results note

## 2016-09-17 DIAGNOSIS — I10 Essential (primary) hypertension: Secondary | ICD-10-CM | POA: Diagnosis not present

## 2016-09-17 DIAGNOSIS — E1165 Type 2 diabetes mellitus with hyperglycemia: Secondary | ICD-10-CM | POA: Diagnosis not present

## 2016-09-17 DIAGNOSIS — E538 Deficiency of other specified B group vitamins: Secondary | ICD-10-CM | POA: Diagnosis not present

## 2016-10-03 ENCOUNTER — Encounter: Payer: Self-pay | Admitting: *Deleted

## 2016-10-04 ENCOUNTER — Ambulatory Visit: Payer: Commercial Managed Care - HMO | Admitting: Anesthesiology

## 2016-10-04 ENCOUNTER — Ambulatory Visit
Admission: RE | Admit: 2016-10-04 | Discharge: 2016-10-04 | Disposition: A | Discharge status: Home / Self Care | Payer: Commercial Managed Care - HMO | Source: Ambulatory Visit | Attending: Unknown Physician Specialty | Admitting: Unknown Physician Specialty

## 2016-10-04 ENCOUNTER — Encounter: Payer: Self-pay | Admitting: *Deleted

## 2016-10-04 ENCOUNTER — Encounter
Admission: RE | Disposition: A | Discharge status: Home / Self Care | Payer: Self-pay | Source: Ambulatory Visit | Attending: Unknown Physician Specialty

## 2016-10-04 DIAGNOSIS — I10 Essential (primary) hypertension: Secondary | ICD-10-CM | POA: Diagnosis not present

## 2016-10-04 DIAGNOSIS — Z7984 Long term (current) use of oral hypoglycemic drugs: Secondary | ICD-10-CM | POA: Diagnosis not present

## 2016-10-04 DIAGNOSIS — Z8719 Personal history of other diseases of the digestive system: Secondary | ICD-10-CM | POA: Diagnosis not present

## 2016-10-04 DIAGNOSIS — Z86010 Personal history of colonic polyps: Secondary | ICD-10-CM | POA: Diagnosis not present

## 2016-10-04 DIAGNOSIS — E119 Type 2 diabetes mellitus without complications: Secondary | ICD-10-CM | POA: Insufficient documentation

## 2016-10-04 DIAGNOSIS — K635 Polyp of colon: Secondary | ICD-10-CM | POA: Insufficient documentation

## 2016-10-04 DIAGNOSIS — D120 Benign neoplasm of cecum: Secondary | ICD-10-CM | POA: Insufficient documentation

## 2016-10-04 DIAGNOSIS — Z1211 Encounter for screening for malignant neoplasm of colon: Secondary | ICD-10-CM | POA: Insufficient documentation

## 2016-10-04 DIAGNOSIS — D122 Benign neoplasm of ascending colon: Secondary | ICD-10-CM | POA: Insufficient documentation

## 2016-10-04 DIAGNOSIS — F17290 Nicotine dependence, other tobacco product, uncomplicated: Secondary | ICD-10-CM | POA: Insufficient documentation

## 2016-10-04 DIAGNOSIS — D123 Benign neoplasm of transverse colon: Secondary | ICD-10-CM | POA: Diagnosis not present

## 2016-10-04 DIAGNOSIS — K648 Other hemorrhoids: Secondary | ICD-10-CM | POA: Diagnosis not present

## 2016-10-04 HISTORY — DX: Essential (primary) hypertension: I10

## 2016-10-04 HISTORY — PX: COLONOSCOPY WITH PROPOFOL: SHX5780

## 2016-10-04 LAB — GLUCOSE, CAPILLARY: GLUCOSE-CAPILLARY: 176 mg/dL — AB (ref 65–99)

## 2016-10-04 SURGERY — COLONOSCOPY WITH PROPOFOL
Anesthesia: General

## 2016-10-04 MED ORDER — FENTANYL CITRATE (PF) 100 MCG/2ML IJ SOLN
INTRAMUSCULAR | Status: DC | PRN
  Administered 2016-10-04 (×2): 50 ug via INTRAVENOUS

## 2016-10-04 MED ORDER — SODIUM CHLORIDE 0.9 % IV SOLN
INTRAVENOUS | Status: DC
  Administered 2016-10-04: 08:00:00 via INTRAVENOUS

## 2016-10-04 MED ORDER — MIDAZOLAM HCL 5 MG/5ML IJ SOLN
INTRAMUSCULAR | Status: DC | PRN
  Administered 2016-10-04: 2 mg via INTRAVENOUS

## 2016-10-04 MED ORDER — PROPOFOL 10 MG/ML IV BOLUS
INTRAVENOUS | Status: AC
  Filled 2016-10-04: qty 20

## 2016-10-04 MED ORDER — FENTANYL CITRATE (PF) 100 MCG/2ML IJ SOLN
INTRAMUSCULAR | Status: AC
  Filled 2016-10-04: qty 2

## 2016-10-04 MED ORDER — PROPOFOL 10 MG/ML IV BOLUS
INTRAVENOUS | Status: DC | PRN
  Administered 2016-10-04: 20 mg via INTRAVENOUS
  Administered 2016-10-04: 30 mg via INTRAVENOUS

## 2016-10-04 MED ORDER — SODIUM CHLORIDE 0.9 % IV SOLN: INTRAVENOUS | Status: DC

## 2016-10-04 MED ORDER — LIDOCAINE HCL (PF) 2 % IJ SOLN
INTRAMUSCULAR | Status: DC | PRN
  Administered 2016-10-04: 50 mg

## 2016-10-04 MED ORDER — MIDAZOLAM HCL 2 MG/2ML IJ SOLN
INTRAMUSCULAR | Status: AC
  Filled 2016-10-04: qty 2

## 2016-10-04 MED ORDER — PROPOFOL 500 MG/50ML IV EMUL
INTRAVENOUS | Status: DC | PRN
  Administered 2016-10-04: 75 ug/kg/min via INTRAVENOUS

## 2016-10-04 NOTE — Anesthesia Preprocedure Evaluation (Signed)
Anesthesia Evaluation  Patient identified by MRN, date of birth, ID band Patient awake    Reviewed: Allergy & Precautions, H&P , NPO status , Patient's Chart, lab work & pertinent test results, reviewed documented beta blocker date and time   Airway Mallampati: II   Neck ROM: full    Dental  (+) Poor Dentition   Pulmonary neg pulmonary ROS, Current Smoker,    Pulmonary exam normal        Cardiovascular hypertension, negative cardio ROS Normal cardiovascular exam Rhythm:regular Rate:Normal     Neuro/Psych negative neurological ROS  negative psych ROS   GI/Hepatic negative GI ROS, Neg liver ROS,   Endo/Other  negative endocrine ROSdiabetes  Renal/GU negative Renal ROS  negative genitourinary   Musculoskeletal   Abdominal   Peds  Hematology negative hematology ROS (+)   Anesthesia Other Findings Past Medical History: No date: BPH (benign prostatic hypertrophy) No date: Diabetes mellitus     Comment: typeII No date: ED (erectile dysfunction) No date: Hypertension Past Surgical History: 2004: EYE SURGERY     Comment: Lasik 1991: planter fascitis surgery BMI    Body Mass Index:  28.89 kg/m     Reproductive/Obstetrics negative OB ROS                             Anesthesia Physical Anesthesia Plan  ASA: II  Anesthesia Plan: General   Post-op Pain Management:    Induction:   Airway Management Planned:   Additional Equipment:   Intra-op Plan:   Post-operative Plan:   Informed Consent: I have reviewed the patients History and Physical, chart, labs and discussed the procedure including the risks, benefits and alternatives for the proposed anesthesia with the patient or authorized representative who has indicated his/her understanding and acceptance.   Dental Advisory Given  Plan Discussed with: CRNA  Anesthesia Plan Comments:         Anesthesia Quick Evaluation

## 2016-10-04 NOTE — Op Note (Signed)
[facility] Gastroenterology Patient Name: Matthew Burns Procedure Date: 10/04/2016 8:00 AM MRN: [id number] Account #: [id number] Date of Birth: 03/02/1956 Admit Type: Outpatient Age: 61 Room: [facility] ENDO ROOM 1 Gender: Male Note Status: Finalized Procedure:            Colonoscopy Indications:          High risk colon cancer surveillance: Personal history                        of colonic polyps Providers:            Manya Silvas, MD Referring MD:         Precious Bard, MD (Referring MD) Medicines:            Propofol per Anesthesia Complications:        No immediate complications. Procedure:            Pre-Anesthesia Assessment:                       - After reviewing the risks and benefits, the patient                        was deemed in satisfactory condition to undergo the                        procedure.                       After obtaining informed consent, the colonoscope was                        passed under direct vision. Throughout the procedure,                        the patient's blood pressure, pulse, and oxygen                        saturations were monitored continuously. The                        Colonoscope was introduced through the anus and                        advanced to the the cecum, identified by appendiceal                        orifice and ileocecal valve. The colonoscopy was                        performed without difficulty. The patient tolerated the                        procedure well. The quality of the bowel preparation                        was good. Findings:      A small polyp was found in the cecum. The polyp was sessile. The polyp       was removed with a hot snare. Resection and retrieval were complete.      A small polyp was found in the ascending colon. The polyp was  sessile.       The polyp was removed with a hot snare. Resection and retrieval were       complete.      A small polyp was found  in the ascending colon. The polyp was sessile.       The polyp was removed with a hot snare. Resection and retrieval were       complete.      A small polyp was found in the ascending colon. The polyp was sessile.       The polyp was removed with a hot snare. Resection and retrieval were       complete.      A diminutive polyp was found in the transverse colon. The polyp was       sessile. The polyp was removed with a jumbo cold forceps. Resection and       retrieval were complete. Impression:           - One small polyp in the cecum, removed with a hot                        snare. Resected and retrieved.                       - One small polyp in the ascending colon, removed with                        a hot snare. Resected and retrieved.                       - One small polyp in the ascending colon, removed with                        a hot snare. Resected and retrieved.                       - One small polyp in the ascending colon, removed with                        a hot snare. Resected and retrieved.                       - One diminutive polyp in the transverse colon, removed                        with a jumbo cold forceps. Resected and retrieved. Recommendation:       - Await pathology results. Manya Silvas, MD 10/04/2016 8:36:14 AM This report has been signed electronically. Number of Addenda: 0 Note Initiated On: 10/04/2016 8:00 AM Scope Withdrawal Time: 0 hours 20 minutes 11 seconds  Total Procedure Duration: 0 hours 24 minutes 1 second       [facility]

## 2016-10-04 NOTE — Anesthesia Post-op Follow-up Note (Cosign Needed)
Anesthesia QCDR form completed.        

## 2016-10-04 NOTE — H&P (Signed)
Primary Care Physician:  Marinda Elk, MD Primary Gastroenterologist:  Dr. Vira Agar  Pre-Procedure History & Physical: HPI:  Matthew Burns is a 61 y.o. male is here for an colonoscopy.   Past Medical History:  Diagnosis Date  . BPH (benign prostatic hypertrophy)   . Diabetes mellitus    typeII  . ED (erectile dysfunction)   . Hypertension     Past Surgical History:  Procedure Laterality Date  . EYE SURGERY  2004   Lasik  . planter fascitis surgery  1991    Prior to Admission medications   Medication Sig Start Date End Date Taking? Authorizing Provider  INVOKAMET 50-500 MG TABS Take 1 tablet by mouth 2 (two) times daily. 07/05/16  Yes [provider]  lisinopril (PRINIVIL,ZESTRIL) 5 MG tablet Take 5 mg by mouth daily. 07/31/16  Yes [provider]  isosorbide mononitrate (IMDUR) 30 MG 24 hr tablet Take 1 tablet (30 mg total) by mouth daily. Patient not taking: Reported on 10/04/2016 08/06/16   Wende Bushy, MD  Lancets MISC Use as directed 01/21/11   Tonia Ghent, MD  nitroGLYCERIN (NITROSTAT) 0.4 MG SL tablet Place 1 tablet (0.4 mg total) under the tongue every 5 (five) minutes as needed. 08/06/16   Wende Bushy, MD    Allergies as of 08/05/2016 - Review Complete 08/05/2016  Allergen Reaction Noted  . Metformin and related  10/09/2011  . Viagra [sildenafil citrate]  01/16/2012    Family History  Problem Relation Age of Onset  . Diabetes Mother        glaucoma  . Glaucoma Mother   . Heart disease Father        CAD s/p stent//glaucoma  . Prostate cancer Father        local radiation, no chemo  . Heart attack Father   . Glaucoma Father   . Diabetes Maternal Grandmother   . Diabetes Maternal Grandfather   . Diabetes Paternal Grandmother   . Diabetes Paternal Grandfather   . Colon cancer Neg Hx     Social History   Social History  . Marital status: Married    Spouse name: N/A  . Number of children: N/A  . Years of education:  N/A   Occupational History  . Not on file.   Social History Main Topics  . Smoking status: Current Some Day Smoker    Types: Cigars    Last attempt to quit: 08/09/2010  . Smokeless tobacco: Never Used     Comment: 4 cigars a week.  . Alcohol use No  . Drug use: No  . Sexual activity: Not on file   Other Topics Concern  . Not on file   Social History Narrative   No regular exercise    From [place], Enjoys golf   Education: Therapist, occupational   Married: 1987;  2 kids , 2 grandchildren   Network engineer, has CDL    Review of Systems: See HPI, otherwise negative ROS  Physical Exam: BP 138/81   Pulse 86   Temp (!) 96.3 F (35.7 C) (Tympanic)   Resp 18   Ht 6' (1.829 m)   Wt 96.6 kg (213 lb)   SpO2 99%   BMI 28.89 kg/m  General:   Alert,  pleasant and cooperative in NAD Head:  Normocephalic and atraumatic. Neck:  Supple; no masses or thyromegaly. Lungs:  Clear throughout to auscultation.    Heart:  Regular rate and rhythm. Abdomen:  Soft, nontender and nondistended. Normal bowel  sounds, without guarding, and without rebound.   Neurologic:  Alert and  oriented x4;  grossly normal neurologically.  Impression/Plan: Matthew Burns is here for an colonoscopy to be performed for [facility] colon polyps  Risks, benefits, limitations, and alternatives regarding  colonoscopy have been reviewed with the patient.  Questions have been answered.  All parties agreeable.   Gaylyn Cheers, MD  10/04/2016, 7:59 AM

## 2016-10-04 NOTE — Transfer of Care (Signed)
Immediate Anesthesia Transfer of Care Note  Patient: Matthew Burns  Procedure(s) Performed: Procedure(s): COLONOSCOPY WITH PROPOFOL (N/A)  Patient Location: PACU  Anesthesia Type:General  Level of Consciousness: sedated  Airway & Oxygen Therapy: Patient Spontanous Breathing and Patient connected to nasal cannula oxygen  Post-op Assessment: Report given to RN and Post -op Vital signs reviewed and stable  Post vital signs: Reviewed and stable  Last Vitals:  Vitals:   10/04/16 0735  BP: 138/81  Pulse: 86  Resp: 18  Temp: (!) 35.7 C    Last Pain:  Vitals:   10/04/16 0735  TempSrc: Tympanic         Complications: No apparent anesthesia complications

## 2016-10-07 ENCOUNTER — Encounter: Payer: Self-pay | Admitting: Unknown Physician Specialty

## 2016-10-07 LAB — SURGICAL PATHOLOGY

## 2016-10-07 NOTE — Anesthesia Postprocedure Evaluation (Signed)
Anesthesia Post Note  Patient: Matthew Burns  Procedure(s) Performed: Procedure(s) (LRB): COLONOSCOPY WITH PROPOFOL (N/A)  Patient location during evaluation: PACU Anesthesia Type: General Level of consciousness: awake and alert Pain management: pain level controlled Vital Signs Assessment: post-procedure vital signs reviewed and stable Respiratory status: spontaneous breathing, nonlabored ventilation, respiratory function stable and patient connected to nasal cannula oxygen Cardiovascular status: blood pressure returned to baseline and stable Postop Assessment: no signs of nausea or vomiting Anesthetic complications: no     Last Vitals:  Vitals:   10/04/16 0856 10/04/16 0906  BP: 123/81 120/81  Pulse: 74 72  Resp: (!) 23 13  Temp:      Last Pain:  Vitals:   10/04/16 0836  TempSrc: Tympanic                 Molli Barrows

## 2017-01-17 DIAGNOSIS — I10 Essential (primary) hypertension: Secondary | ICD-10-CM | POA: Diagnosis not present

## 2017-01-17 DIAGNOSIS — E1165 Type 2 diabetes mellitus with hyperglycemia: Secondary | ICD-10-CM | POA: Diagnosis not present

## 2017-01-17 DIAGNOSIS — N401 Enlarged prostate with lower urinary tract symptoms: Secondary | ICD-10-CM | POA: Diagnosis not present

## 2017-05-09 DIAGNOSIS — Z0000 Encounter for general adult medical examination without abnormal findings: Secondary | ICD-10-CM | POA: Diagnosis not present

## 2017-05-09 DIAGNOSIS — I10 Essential (primary) hypertension: Secondary | ICD-10-CM | POA: Diagnosis not present

## 2017-05-09 DIAGNOSIS — E1165 Type 2 diabetes mellitus with hyperglycemia: Secondary | ICD-10-CM | POA: Diagnosis not present

## 2017-05-15 DIAGNOSIS — E1165 Type 2 diabetes mellitus with hyperglycemia: Secondary | ICD-10-CM | POA: Diagnosis not present

## 2017-05-15 DIAGNOSIS — Z23 Encounter for immunization: Secondary | ICD-10-CM | POA: Diagnosis not present

## 2017-05-15 DIAGNOSIS — I10 Essential (primary) hypertension: Secondary | ICD-10-CM | POA: Diagnosis not present

## 2017-05-15 DIAGNOSIS — Z0000 Encounter for general adult medical examination without abnormal findings: Secondary | ICD-10-CM | POA: Diagnosis not present

## 2017-05-29 DIAGNOSIS — Z23 Encounter for immunization: Secondary | ICD-10-CM | POA: Diagnosis not present

## 2017-09-11 DIAGNOSIS — I10 Essential (primary) hypertension: Secondary | ICD-10-CM | POA: Diagnosis not present

## 2017-09-11 DIAGNOSIS — E1165 Type 2 diabetes mellitus with hyperglycemia: Secondary | ICD-10-CM | POA: Diagnosis not present

## 2017-09-15 DIAGNOSIS — E782 Mixed hyperlipidemia: Secondary | ICD-10-CM | POA: Diagnosis not present

## 2017-09-15 DIAGNOSIS — E1165 Type 2 diabetes mellitus with hyperglycemia: Secondary | ICD-10-CM | POA: Diagnosis not present

## 2017-09-15 DIAGNOSIS — I10 Essential (primary) hypertension: Secondary | ICD-10-CM | POA: Diagnosis not present

## 2018-01-23 DIAGNOSIS — E538 Deficiency of other specified B group vitamins: Secondary | ICD-10-CM | POA: Diagnosis not present

## 2018-01-23 DIAGNOSIS — E1165 Type 2 diabetes mellitus with hyperglycemia: Secondary | ICD-10-CM | POA: Diagnosis not present

## 2018-02-09 DIAGNOSIS — E1165 Type 2 diabetes mellitus with hyperglycemia: Secondary | ICD-10-CM | POA: Diagnosis not present

## 2018-02-09 DIAGNOSIS — E782 Mixed hyperlipidemia: Secondary | ICD-10-CM | POA: Diagnosis not present

## 2018-02-09 DIAGNOSIS — I10 Essential (primary) hypertension: Secondary | ICD-10-CM | POA: Diagnosis not present

## 2018-03-20 DIAGNOSIS — Z23 Encounter for immunization: Secondary | ICD-10-CM | POA: Diagnosis not present

## 2018-05-07 DIAGNOSIS — M7542 Impingement syndrome of left shoulder: Secondary | ICD-10-CM | POA: Diagnosis not present

## 2018-05-28 DIAGNOSIS — I10 Essential (primary) hypertension: Secondary | ICD-10-CM | POA: Diagnosis not present

## 2018-05-28 DIAGNOSIS — E782 Mixed hyperlipidemia: Secondary | ICD-10-CM | POA: Diagnosis not present

## 2018-05-28 DIAGNOSIS — E1165 Type 2 diabetes mellitus with hyperglycemia: Secondary | ICD-10-CM | POA: Diagnosis not present

## 2018-06-01 DIAGNOSIS — M7542 Impingement syndrome of left shoulder: Secondary | ICD-10-CM | POA: Diagnosis not present

## 2018-06-12 DIAGNOSIS — S43432A Superior glenoid labrum lesion of left shoulder, initial encounter: Secondary | ICD-10-CM | POA: Diagnosis not present

## 2018-06-15 ENCOUNTER — Other Ambulatory Visit: Payer: Self-pay | Admitting: Orthopedic Surgery

## 2018-06-15 DIAGNOSIS — Z0000 Encounter for general adult medical examination without abnormal findings: Secondary | ICD-10-CM | POA: Diagnosis not present

## 2018-06-15 DIAGNOSIS — E782 Mixed hyperlipidemia: Secondary | ICD-10-CM | POA: Diagnosis not present

## 2018-06-15 DIAGNOSIS — I10 Essential (primary) hypertension: Secondary | ICD-10-CM | POA: Diagnosis not present

## 2018-06-18 ENCOUNTER — Other Ambulatory Visit: Payer: Self-pay

## 2018-06-18 ENCOUNTER — Encounter
Admission: RE | Admit: 2018-06-18 | Discharge: 2018-06-18 | Disposition: A | Discharge status: Home / Self Care | Payer: 59 | Source: Ambulatory Visit | Attending: Surgery | Admitting: Surgery

## 2018-06-18 HISTORY — DX: Gastro-esophageal reflux disease without esophagitis: K21.9

## 2018-06-18 NOTE — Patient Instructions (Signed)
Your procedure is scheduled on: 06-23-18 TUESDAY Report to Same Day Surgery 2nd floor medical mall [facility] Entrance-take elevator on left to 2nd floor.  Check in with surgery information desk.) To find out your arrival time please call [phone] between 1PM - 3PM on 06-22-18 MONDAY  Remember: Instructions that are not followed completely may result in serious medical risk, up to and including death, or upon the discretion of your surgeon and anesthesiologist your surgery may need to be rescheduled.    _x___ 1. Do not eat food after midnight the night before your procedure. NO GUM OR CANDY AFTER MIDNIGHT. You may drink clear liquids up to 2 hours before you are scheduled to arrive at the hospital for your procedure.  Do not drink clear liquids within 2 hours of your scheduled arrival to the hospital.  Type 1 and type 2 diabetics should only drink water.   ____Ensure clear carbohydrate drink on the way to the hospital for bariatric patients  ____Ensure clear carbohydrate drink 3 hours before surgery for Dr Dwyane Luo patients if physician instructed.    __x__ 2. No Alcohol for 24 hours before or after surgery.   __x__3. No Smoking or e-cigarettes for 24 prior to surgery.  Do not use any chewable tobacco products for at least 6 hour prior to surgery   ____  4. Bring all medications with you on the day of surgery if instructed.    __x__ 5. Notify your doctor if there is any change in your medical condition     (cold, fever, infections).    x___6. On the morning of surgery brush your teeth with toothpaste and water.  You may rinse your mouth with mouth wash if you wish.  Do not swallow any toothpaste or mouthwash.   Do not wear jewelry, make-up, hairpins, clips or nail polish.  Do not wear lotions, powders, or perfumes. You may wear deodorant.  Do not shave 48 hours prior to surgery. Men may shave face and neck.  Do not bring valuables to the hospital.    [facility] is not  responsible for any belongings or valuables.               Contacts, dentures or bridgework may not be worn into surgery.  Leave your suitcase in the car. After surgery it may be brought to your room.  For patients admitted to the hospital, discharge time is determined by your treatment team.  _  Patients discharged the day of surgery will not be allowed to drive home.  You will need someone to drive you home and stay with you the night of your procedure.    Please read over the following fact sheets that you were given:   [facility] Preparing for Surgery   ____ Take anti-hypertensive listed below, cardiac, seizure, asthma, anti-reflux and psychiatric medicines. These include:  1. NONE  2.  3.  4.  5.  6.  ____Fleets enema or Magnesium Citrate as directed.   _x___ Use CHG Soap or sage wipes as directed on instruction sheet   ____ Use inhalers on the day of surgery and bring to hospital day of surgery  ____ Stop Metformin 2 days prior to surgery-LAST DOSE OF  INVOKAMET ON Saturday, January 25TH  ____ Take 1/2 of usual insulin dose the night before surgery and none on the morning surgery.   ____ Follow recommendations from Cardiologist, Pulmonologist or PCP regarding stopping Aspirin, Coumadin, Plavix ,Eliquis, Effient, or Pradaxa, and Pletal.  X____Stop Anti-inflammatories such as Advil, Aleve, Ibuprofen, Motrin, Naproxen, Naprosyn, Goodies powders or aspirin products NOW-OK to take Tylenol   ____ Stop supplements until after surgery.     ____ Bring C-Pap to the hospital.

## 2018-06-22 ENCOUNTER — Other Ambulatory Visit: Payer: Self-pay

## 2018-06-22 ENCOUNTER — Encounter
Admission: RE | Admit: 2018-06-22 | Discharge: 2018-06-22 | Disposition: A | Discharge status: Home / Self Care | Payer: Commercial Managed Care - HMO | Source: Ambulatory Visit | Attending: Orthopedic Surgery | Admitting: Orthopedic Surgery

## 2018-06-22 DIAGNOSIS — Z8042 Family history of malignant neoplasm of prostate: Secondary | ICD-10-CM | POA: Diagnosis not present

## 2018-06-22 DIAGNOSIS — Z888 Allergy status to other drugs, medicaments and biological substances status: Secondary | ICD-10-CM | POA: Diagnosis not present

## 2018-06-22 DIAGNOSIS — E119 Type 2 diabetes mellitus without complications: Secondary | ICD-10-CM | POA: Diagnosis not present

## 2018-06-22 DIAGNOSIS — S43432A Superior glenoid labrum lesion of left shoulder, initial encounter: Secondary | ICD-10-CM | POA: Diagnosis not present

## 2018-06-22 DIAGNOSIS — Z791 Long term (current) use of non-steroidal anti-inflammatories (NSAID): Secondary | ICD-10-CM | POA: Diagnosis not present

## 2018-06-22 DIAGNOSIS — X58XXXA Exposure to other specified factors, initial encounter: Secondary | ICD-10-CM | POA: Diagnosis not present

## 2018-06-22 DIAGNOSIS — Z01818 Encounter for other preprocedural examination: Secondary | ICD-10-CM | POA: Insufficient documentation

## 2018-06-22 DIAGNOSIS — Z7984 Long term (current) use of oral hypoglycemic drugs: Secondary | ICD-10-CM | POA: Diagnosis not present

## 2018-06-22 DIAGNOSIS — Z79899 Other long term (current) drug therapy: Secondary | ICD-10-CM | POA: Diagnosis not present

## 2018-06-22 DIAGNOSIS — Z820 Family history of epilepsy and other diseases of the nervous system: Secondary | ICD-10-CM | POA: Diagnosis not present

## 2018-06-22 DIAGNOSIS — K219 Gastro-esophageal reflux disease without esophagitis: Secondary | ICD-10-CM | POA: Diagnosis not present

## 2018-06-22 DIAGNOSIS — N400 Enlarged prostate without lower urinary tract symptoms: Secondary | ICD-10-CM | POA: Diagnosis not present

## 2018-06-22 DIAGNOSIS — F17290 Nicotine dependence, other tobacco product, uncomplicated: Secondary | ICD-10-CM | POA: Diagnosis not present

## 2018-06-22 DIAGNOSIS — I10 Essential (primary) hypertension: Secondary | ICD-10-CM | POA: Diagnosis not present

## 2018-06-22 DIAGNOSIS — Z8249 Family history of ischemic heart disease and other diseases of the circulatory system: Secondary | ICD-10-CM | POA: Diagnosis not present

## 2018-06-22 DIAGNOSIS — Z833 Family history of diabetes mellitus: Secondary | ICD-10-CM | POA: Diagnosis not present

## 2018-06-22 LAB — APTT: aPTT: 28 seconds (ref 24–36)

## 2018-06-22 LAB — PROTIME-INR
INR: 0.97
PROTHROMBIN TIME: 12.8 s (ref 11.4–15.2)

## 2018-06-22 MED ORDER — CEFAZOLIN SODIUM-DEXTROSE 2-4 GM/100ML-% IV SOLN
2.0000 g | INTRAVENOUS | Status: AC
  Administered 2018-06-23: 2 g via INTRAVENOUS

## 2018-06-23 ENCOUNTER — Ambulatory Visit
Admission: RE | Admit: 2018-06-23 | Discharge: 2018-06-23 | Disposition: A | Discharge status: Home / Self Care | Payer: Commercial Managed Care - HMO | Attending: Orthopedic Surgery | Admitting: Orthopedic Surgery

## 2018-06-23 ENCOUNTER — Ambulatory Visit: Payer: Commercial Managed Care - HMO | Admitting: Anesthesiology

## 2018-06-23 ENCOUNTER — Encounter
Admission: RE | Disposition: A | Discharge status: Home / Self Care | Payer: Self-pay | Source: Home / Self Care | Attending: Orthopedic Surgery

## 2018-06-23 ENCOUNTER — Other Ambulatory Visit: Payer: Self-pay

## 2018-06-23 ENCOUNTER — Encounter: Payer: Self-pay | Admitting: *Deleted

## 2018-06-23 DIAGNOSIS — M7542 Impingement syndrome of left shoulder: Secondary | ICD-10-CM | POA: Diagnosis not present

## 2018-06-23 DIAGNOSIS — Z8042 Family history of malignant neoplasm of prostate: Secondary | ICD-10-CM | POA: Insufficient documentation

## 2018-06-23 DIAGNOSIS — S43432A Superior glenoid labrum lesion of left shoulder, initial encounter: Secondary | ICD-10-CM | POA: Diagnosis not present

## 2018-06-23 DIAGNOSIS — Z7984 Long term (current) use of oral hypoglycemic drugs: Secondary | ICD-10-CM | POA: Insufficient documentation

## 2018-06-23 DIAGNOSIS — E119 Type 2 diabetes mellitus without complications: Secondary | ICD-10-CM | POA: Diagnosis not present

## 2018-06-23 DIAGNOSIS — Z888 Allergy status to other drugs, medicaments and biological substances status: Secondary | ICD-10-CM | POA: Insufficient documentation

## 2018-06-23 DIAGNOSIS — S43492A Other sprain of left shoulder joint, initial encounter: Secondary | ICD-10-CM | POA: Diagnosis not present

## 2018-06-23 DIAGNOSIS — Z8249 Family history of ischemic heart disease and other diseases of the circulatory system: Secondary | ICD-10-CM | POA: Insufficient documentation

## 2018-06-23 DIAGNOSIS — N400 Enlarged prostate without lower urinary tract symptoms: Secondary | ICD-10-CM | POA: Diagnosis not present

## 2018-06-23 DIAGNOSIS — M19012 Primary osteoarthritis, left shoulder: Secondary | ICD-10-CM | POA: Diagnosis not present

## 2018-06-23 DIAGNOSIS — Z820 Family history of epilepsy and other diseases of the nervous system: Secondary | ICD-10-CM | POA: Insufficient documentation

## 2018-06-23 DIAGNOSIS — K219 Gastro-esophageal reflux disease without esophagitis: Secondary | ICD-10-CM | POA: Insufficient documentation

## 2018-06-23 DIAGNOSIS — Z833 Family history of diabetes mellitus: Secondary | ICD-10-CM | POA: Insufficient documentation

## 2018-06-23 DIAGNOSIS — X58XXXA Exposure to other specified factors, initial encounter: Secondary | ICD-10-CM | POA: Insufficient documentation

## 2018-06-23 DIAGNOSIS — Z79899 Other long term (current) drug therapy: Secondary | ICD-10-CM | POA: Insufficient documentation

## 2018-06-23 DIAGNOSIS — G8918 Other acute postprocedural pain: Secondary | ICD-10-CM | POA: Diagnosis not present

## 2018-06-23 DIAGNOSIS — M25512 Pain in left shoulder: Secondary | ICD-10-CM | POA: Diagnosis not present

## 2018-06-23 DIAGNOSIS — I10 Essential (primary) hypertension: Secondary | ICD-10-CM | POA: Insufficient documentation

## 2018-06-23 DIAGNOSIS — F17290 Nicotine dependence, other tobacco product, uncomplicated: Secondary | ICD-10-CM | POA: Insufficient documentation

## 2018-06-23 DIAGNOSIS — Z791 Long term (current) use of non-steroidal anti-inflammatories (NSAID): Secondary | ICD-10-CM | POA: Insufficient documentation

## 2018-06-23 HISTORY — PX: SUBACROMIAL DECOMPRESSION: SHX5174

## 2018-06-23 HISTORY — PX: SHOULDER ARTHROSCOPY WITH OPEN ROTATOR CUFF REPAIR: SHX6092

## 2018-06-23 LAB — CBC WITH DIFFERENTIAL/PLATELET
Abs Immature Granulocytes: 0.02 10*3/uL (ref 0.00–0.07)
Basophils Absolute: 0.1 10*3/uL (ref 0.0–0.1)
Basophils Relative: 1 %
Eosinophils Absolute: 0.1 10*3/uL (ref 0.0–0.5)
Eosinophils Relative: 2 %
HCT: 45.5 % (ref 39.0–52.0)
Hemoglobin: 16.1 g/dL (ref 13.0–17.0)
IMMATURE GRANULOCYTES: 0 %
Lymphocytes Relative: 25 %
Lymphs Abs: 1.3 10*3/uL (ref 0.7–4.0)
MCH: 30.4 pg (ref 26.0–34.0)
MCHC: 35.4 g/dL (ref 30.0–36.0)
MCV: 85.8 fL (ref 80.0–100.0)
MONOS PCT: 10 %
Monocytes Absolute: 0.5 10*3/uL (ref 0.1–1.0)
Neutro Abs: 3.3 10*3/uL (ref 1.7–7.7)
Neutrophils Relative %: 62 %
Platelets: 150 10*3/uL (ref 150–400)
RBC: 5.3 MIL/uL (ref 4.22–5.81)
RDW: 11.8 % (ref 11.5–15.5)
WBC: 5.4 10*3/uL (ref 4.0–10.5)
nRBC: 0 % (ref 0.0–0.2)

## 2018-06-23 LAB — BASIC METABOLIC PANEL
Anion gap: 5 (ref 5–15)
BUN: 15 mg/dL (ref 8–23)
CO2: 26 mmol/L (ref 22–32)
Calcium: 8.4 mg/dL — ABNORMAL LOW (ref 8.9–10.3)
Chloride: 104 mmol/L (ref 98–111)
Creatinine, Ser: 0.97 mg/dL (ref 0.61–1.24)
GFR calc Af Amer: >60 mL/min (ref >60)
GFR calc non Af Amer: >60 mL/min (ref >60)
Glucose, Bld: 189 mg/dL — ABNORMAL HIGH (ref 70–99)
Potassium: 4.5 mmol/L (ref 3.5–5.1)
Sodium: 135 mmol/L (ref 135–145)

## 2018-06-23 LAB — GLUCOSE, CAPILLARY
Glucose-Capillary: 182 mg/dL — ABNORMAL HIGH (ref 70–99)
Glucose-Capillary: 199 mg/dL — ABNORMAL HIGH (ref 70–99)

## 2018-06-23 SURGERY — ARTHROSCOPY, SHOULDER WITH REPAIR, ROTATOR CUFF, OPEN
Anesthesia: Regional | Laterality: Left

## 2018-06-23 MED ORDER — FENTANYL CITRATE (PF) 100 MCG/2ML IJ SOLN
50.0000 ug | Freq: Once | INTRAMUSCULAR | Status: AC
  Administered 2018-06-23: 50 ug via INTRAVENOUS

## 2018-06-23 MED ORDER — PROPOFOL 10 MG/ML IV BOLUS
INTRAVENOUS | Status: AC
  Filled 2018-06-23: qty 20

## 2018-06-23 MED ORDER — ONDANSETRON HCL 4 MG/2ML IJ SOLN
INTRAMUSCULAR | Status: DC | PRN
  Administered 2018-06-23: 4 mg via INTRAVENOUS

## 2018-06-23 MED ORDER — SODIUM CHLORIDE 0.9 % IV SOLN
INTRAVENOUS | Status: DC
  Administered 2018-06-23: 08:00:00 via INTRAVENOUS

## 2018-06-23 MED ORDER — BUPIVACAINE LIPOSOME 1.3 % IJ SUSP
INTRAMUSCULAR | Status: DC | PRN
  Administered 2018-06-23 (×2): 10 mL

## 2018-06-23 MED ORDER — CHLORHEXIDINE GLUCONATE CLOTH 2 % EX PADS: 6.0000 | MEDICATED_PAD | Freq: Once | CUTANEOUS | Status: DC

## 2018-06-23 MED ORDER — CEFAZOLIN SODIUM-DEXTROSE 2-4 GM/100ML-% IV SOLN
INTRAVENOUS | Status: AC
  Filled 2018-06-23: qty 100

## 2018-06-23 MED ORDER — SUGAMMADEX SODIUM 200 MG/2ML IV SOLN
INTRAVENOUS | Status: DC | PRN
  Administered 2018-06-23: 200 mg via INTRAVENOUS

## 2018-06-23 MED ORDER — MIDAZOLAM HCL 2 MG/2ML IJ SOLN
INTRAMUSCULAR | Status: AC
  Administered 2018-06-23: 1 mg via INTRAVENOUS
  Filled 2018-06-23: qty 2

## 2018-06-23 MED ORDER — ROCURONIUM BROMIDE 50 MG/5ML IV SOLN
INTRAVENOUS | Status: AC
  Filled 2018-06-23: qty 1

## 2018-06-23 MED ORDER — BUPIVACAINE HCL (PF) 0.5 % IJ SOLN
INTRAMUSCULAR | Status: AC
  Filled 2018-06-23: qty 10

## 2018-06-23 MED ORDER — OXYCODONE HCL 5 MG PO TABS: 5.0000 mg | ORAL_TABLET | ORAL | 0 refills | Status: AC | PRN

## 2018-06-23 MED ORDER — MIDAZOLAM HCL 2 MG/2ML IJ SOLN
INTRAMUSCULAR | Status: DC | PRN
  Administered 2018-06-23: 2 mg via INTRAVENOUS

## 2018-06-23 MED ORDER — PROPOFOL 10 MG/ML IV BOLUS
INTRAVENOUS | Status: DC | PRN
  Administered 2018-06-23: 30 mg via INTRAVENOUS
  Administered 2018-06-23: 100 mg via INTRAVENOUS
  Administered 2018-06-23: 200 mg via INTRAVENOUS

## 2018-06-23 MED ORDER — PROMETHAZINE HCL 25 MG/ML IJ SOLN: 6.2500 mg | INTRAMUSCULAR | Status: DC | PRN

## 2018-06-23 MED ORDER — FENTANYL CITRATE (PF) 100 MCG/2ML IJ SOLN
INTRAMUSCULAR | Status: AC
  Filled 2018-06-23: qty 2

## 2018-06-23 MED ORDER — FAMOTIDINE 20 MG PO TABS
ORAL_TABLET | ORAL | Status: AC
  Administered 2018-06-23: 20 mg
  Filled 2018-06-23: qty 1

## 2018-06-23 MED ORDER — LACTATED RINGERS IV SOLN
INTRAVENOUS | Status: DC | PRN
  Administered 2018-06-23: 11:00:00 via INTRAVENOUS

## 2018-06-23 MED ORDER — MIDAZOLAM HCL 2 MG/2ML IJ SOLN
1.0000 mg | Freq: Once | INTRAMUSCULAR | Status: AC
  Administered 2018-06-23: 1 mg via INTRAVENOUS

## 2018-06-23 MED ORDER — LIDOCAINE HCL (PF) 1 % IJ SOLN
INTRAMUSCULAR | Status: AC
  Filled 2018-06-23: qty 5

## 2018-06-23 MED ORDER — MIDAZOLAM HCL 2 MG/2ML IJ SOLN
INTRAMUSCULAR | Status: AC
  Filled 2018-06-23: qty 2

## 2018-06-23 MED ORDER — FENTANYL CITRATE (PF) 100 MCG/2ML IJ SOLN: 25.0000 ug | INTRAMUSCULAR | Status: DC | PRN

## 2018-06-23 MED ORDER — BUPIVACAINE HCL (PF) 0.25 % IJ SOLN
INTRAMUSCULAR | Status: AC
  Filled 2018-06-23: qty 30

## 2018-06-23 MED ORDER — VASOPRESSIN 20 UNIT/ML IV SOLN
INTRAVENOUS | Status: DC | PRN
  Administered 2018-06-23 (×2): 1 [IU] via INTRAVENOUS
  Administered 2018-06-23: .5 [IU] via INTRAVENOUS
  Administered 2018-06-23: 1 [IU] via INTRAVENOUS

## 2018-06-23 MED ORDER — ONDANSETRON HCL 4 MG PO TABS: 4.0000 mg | ORAL_TABLET | Freq: Three times a day (TID) | ORAL | 0 refills | Status: AC | PRN

## 2018-06-23 MED ORDER — LIDOCAINE HCL (PF) 1 % IJ SOLN
INTRAMUSCULAR | Status: AC
  Filled 2018-06-23: qty 30

## 2018-06-23 MED ORDER — DEXAMETHASONE SODIUM PHOSPHATE 10 MG/ML IJ SOLN
INTRAMUSCULAR | Status: DC | PRN
  Administered 2018-06-23: 10 mg via INTRAVENOUS

## 2018-06-23 MED ORDER — FENTANYL CITRATE (PF) 100 MCG/2ML IJ SOLN
INTRAMUSCULAR | Status: DC | PRN
  Administered 2018-06-23 (×2): 50 ug via INTRAVENOUS

## 2018-06-23 MED ORDER — SODIUM CHLORIDE 0.9 % IV SOLN
INTRAVENOUS | Status: DC | PRN
  Administered 2018-06-23: 25 ug/min via INTRAVENOUS

## 2018-06-23 MED ORDER — BUPIVACAINE HCL (PF) 0.5 % IJ SOLN
INTRAMUSCULAR | Status: DC | PRN
  Administered 2018-06-23: 10 mL

## 2018-06-23 MED ORDER — FENTANYL CITRATE (PF) 100 MCG/2ML IJ SOLN
INTRAMUSCULAR | Status: AC
  Administered 2018-06-23: 50 ug via INTRAVENOUS
  Filled 2018-06-23: qty 2

## 2018-06-23 MED ORDER — BUPIVACAINE LIPOSOME 1.3 % IJ SUSP
INTRAMUSCULAR | Status: AC
  Filled 2018-06-23: qty 20

## 2018-06-23 MED ORDER — EPINEPHRINE 30 MG/30ML IJ SOLN
INTRAMUSCULAR | Status: AC
  Filled 2018-06-23: qty 1

## 2018-06-23 MED ORDER — LIDOCAINE HCL (CARDIAC) PF 100 MG/5ML IV SOSY
PREFILLED_SYRINGE | INTRAVENOUS | Status: DC | PRN
  Administered 2018-06-23: 100 mg via INTRAVENOUS

## 2018-06-23 MED ORDER — FAMOTIDINE 20 MG PO TABS: 20.0000 mg | ORAL_TABLET | Freq: Once | ORAL | Status: DC

## 2018-06-23 MED ORDER — PHENYLEPHRINE HCL 10 MG/ML IJ SOLN
INTRAMUSCULAR | Status: DC | PRN
  Administered 2018-06-23: 200 ug via INTRAVENOUS
  Administered 2018-06-23 (×3): 100 ug via INTRAVENOUS

## 2018-06-23 SURGICAL SUPPLY — 65 items
ADAPTER IRRIG TUBE 2 SPIKE SOL (ADAPTER) ×6 IMPLANT
ANCHOR SUT  BIOC ST 3X14.5 (Anchor) ×10 IMPLANT
ANCHOR SUT BIOC ST 3X14.5 (Anchor) ×5 IMPLANT
BUR RADIUS 4.0X18.5 (BURR) ×3 IMPLANT
BUR RADIUS 5.5 (BURR) ×3 IMPLANT
CANISTER SUCT LVC 12 LTR MEDI- (MISCELLANEOUS) IMPLANT
CANNULA 5.75X7 CRYSTAL CLEAR (CANNULA) IMPLANT
CANNULA PARTIAL THREAD 2X7 (CANNULA) ×6 IMPLANT
CANNULA TWIST IN 8.25X9CM (CANNULA) IMPLANT
CLOSURE WOUND 1/2 X4 (GAUZE/BANDAGES/DRESSINGS) ×1
COOLER POLAR GLACIER W/PUMP (MISCELLANEOUS) ×3 IMPLANT
COVER WAND RF STERILE (DRAPES) IMPLANT
CRADLE LAMINECT ARM (MISCELLANEOUS) ×3 IMPLANT
DEVICE SUCT BLK HOLE OR FLOOR (MISCELLANEOUS) IMPLANT
DRAPE IMP U-DRAPE 54X76 (DRAPES) ×6 IMPLANT
DRAPE INCISE IOBAN 66X45 STRL (DRAPES) ×3 IMPLANT
DRAPE SHEET LG 3/4 BI-LAMINATE (DRAPES) ×3 IMPLANT
DRAPE U-SHAPE 47X51 STRL (DRAPES) IMPLANT
DURAPREP 26ML APPLICATOR (WOUND CARE) ×9 IMPLANT
ELECT REM PT RETURN 9FT ADLT (ELECTROSURGICAL)
ELECTRODE REM PT RTRN 9FT ADLT (ELECTROSURGICAL) IMPLANT
GAUZE PETRO XEROFOAM 1X8 (MISCELLANEOUS) ×3 IMPLANT
GAUZE SPONGE 4X4 12PLY STRL (GAUZE/BANDAGES/DRESSINGS) ×3 IMPLANT
GLOVE BIOGEL PI IND STRL 9 (GLOVE) ×1 IMPLANT
GLOVE BIOGEL PI INDICATOR 9 (GLOVE) ×2
GLOVE SURG 9.0 ORTHO LTXF (GLOVE) ×6 IMPLANT
GOWN STRL REUS TWL 2XL XL LVL4 (GOWN DISPOSABLE) ×3 IMPLANT
GOWN STRL REUS W/ TWL LRG LVL3 (GOWN DISPOSABLE) ×1 IMPLANT
GOWN STRL REUS W/ TWL LRG LVL4 (GOWN DISPOSABLE) ×1 IMPLANT
GOWN STRL REUS W/TWL LRG LVL3 (GOWN DISPOSABLE) ×2
GOWN STRL REUS W/TWL LRG LVL4 (GOWN DISPOSABLE) ×2
IV LACTATED RINGER IRRG 3000ML (IV SOLUTION) ×34
IV LR IRRIG 3000ML ARTHROMATIC (IV SOLUTION) ×17 IMPLANT
KIT STABILIZATION SHOULDER (MISCELLANEOUS) ×3 IMPLANT
KIT SUTURETAK 3.0 INSERT PERC (KITS) ×3 IMPLANT
KIT TURNOVER KIT A (KITS) ×3 IMPLANT
MANIFOLD NEPTUNE II (INSTRUMENTS) ×3 IMPLANT
MASK FACE SPIDER DISP (MASK) ×3 IMPLANT
MAT ABSORB  FLUID 56X50 GRAY (MISCELLANEOUS) ×4
MAT ABSORB FLUID 56X50 GRAY (MISCELLANEOUS) ×2 IMPLANT
NEEDLE HYPO 22GX1.5 SAFETY (NEEDLE) ×3 IMPLANT
PACK ARTHROSCOPY SHOULDER (MISCELLANEOUS) ×3 IMPLANT
PAD WRAPON POLAR SHDR XLG (MISCELLANEOUS) ×1 IMPLANT
SET TUBE SUCT SHAVER OUTFL 24K (TUBING) ×3 IMPLANT
SET TUBE TIP INTRA-ARTICULAR (MISCELLANEOUS) ×3 IMPLANT
STRAP SAFETY 5IN WIDE (MISCELLANEOUS) ×3 IMPLANT
STRIP CLOSURE SKIN 1/2X4 (GAUZE/BANDAGES/DRESSINGS) ×2 IMPLANT
SUT ETHILON 4-0 (SUTURE) ×2
SUT ETHILON 4-0 FS2 18XMFL BLK (SUTURE) ×1
SUT LASSO 90 DEG SD STR (SUTURE) ×3 IMPLANT
SUT MNCRL 4-0 (SUTURE) ×2
SUT MNCRL 4-0 27XMFL (SUTURE) ×1
SUT PDS AB 0 CT1 27 (SUTURE) IMPLANT
SUT VIC AB 0 CT1 36 (SUTURE) IMPLANT
SUT VIC AB 2-0 CT2 27 (SUTURE) IMPLANT
SUTURE ETHLN 4-0 FS2 18XMF BLK (SUTURE) ×1 IMPLANT
SUTURE MAGNUM WIRE 2X48 BLK (SUTURE) IMPLANT
SUTURE MNCRL 4-0 27XMF (SUTURE) ×1 IMPLANT
TAPE MICROFOAM 4IN (TAPE) ×3 IMPLANT
TUBING ARTHRO INFLOW-ONLY STRL (TUBING) ×3 IMPLANT
TUBING CONNECTING 10 (TUBING) ×2 IMPLANT
TUBING CONNECTING 10' (TUBING) ×1
WAND HAND CNTRL MULTIVAC 90 (MISCELLANEOUS) ×3 IMPLANT
WAND WEREWOLF FLOW 90D (MISCELLANEOUS) IMPLANT
WRAPON POLAR PAD SHDR XLG (MISCELLANEOUS) ×3

## 2018-06-23 NOTE — Anesthesia Post-op Follow-up Note (Signed)
Anesthesia QCDR form completed.        

## 2018-06-23 NOTE — Discharge Instructions (Addendum)
Shoulder Arthroscopy, Care After °This sheet gives you information about how to care for yourself after your procedure. Your health care provider may also give you more specific instructions. If you have problems or questions, contact your health care provider. °What can I expect after the procedure? °After the procedure, it is common to have: °· Pain that can be relieved by taking pain medicine. °· Swelling. °· A small amount of fluid from the incision. °· Stiffness that improves over time. °Follow these instructions at home: °If you have a sling or immobilizer: °· Wear the sling or immobilizer as told by your health care provider. Remove it only as told by your health care provider. These devices protect your shoulder and help it heal by keeping it in place. °· Loosen the sling or immobilizer if your fingers tingle, become numb, or turn cold and blue. °· Keep the sling or immobilizer clean. °· Ask if you may remove the sling or immobilizer for bathing. If you need to keep it on while bathing and it is not waterproof: °? Do not let it get wet. °? Cover it with a watertight covering when you take a bath or a shower. °Incision care ° °· Follow instructions from your health care provider about how to take care of your incisions. Make sure you: °? Wash your hands with soap and water before you change your bandage (dressing). If soap and water are not available, use hand sanitizer. °? Change your dressing as told by your health care provider. °? Leave stitches (sutures), staples, skin glue, or adhesive strips in place. These skin closures may need to stay in place for 2 weeks or longer. If adhesive strip edges start to loosen and curl up, you may trim the loose edges. Do not remove adhesive strips completely unless your health care provider tells you to do that. °· Check your incision areas every day for signs of infection. Check for: °? Redness °? More swelling or pain. °? Blood or more fluid. °? Warmth. °? Pus or a  bad smell. °Bathing °· Do not take baths, swim, or use a hot tub until your health care provider approves. Ask your health care provider if you may take showers. You may only be allowed to take sponge baths. °Activity °· Ask your health care provider what activities are safe for you during recovery, and ask what activities you need to avoid. °· Do not lift with your affected shoulder until your health care provider approves. °· Avoid pulling and pushing with the arm on your affected side. °· If physical therapy was prescribed, do exercises as directed. Doing exercises may help to improve shoulder movement and flexibility (range of motion). °Driving °· Do not drive until your health care provider approves. °· Do not drive or use heavy machinery while taking prescription pain medicine. °Managing pain, stiffness, and swelling ° °· If lying down flat causes shoulder discomfort, it may help to sleep in a sitting position for a few days after your procedure. Try sleeping in a reclining chair or propping yourself up with extra pillows in bed. °· If directed, put ice on the affected area: °? Put ice in a plastic bag or use the icing device (cold therapy unit) that you were given. Follow instructions from your health care provider about how to use the icing device. °? Place a towel between your skin and the bag or between your skin and the icing device. °? Leave the ice on for 20 minutes, 2-3 times   a day. °· Move your fingers often to avoid stiffness and to lessen swelling. °General instructions °· Take over-the-counter and prescription medicines only as told by your health care provider. °· If you are taking prescription pain medicine, take actions to prevent or treat constipation. Your health care provider may recommend that you: °? Drink enough fluid to keep your urine pale yellow. °? Eat foods that are high in fiber, such as fresh fruits and vegetables, whole grains, and beans. °? Limit foods that are high in fat and  processed sugars, such as fried or sweet foods. °? Take an over-the-counter or prescription medicine for constipation. °· Do not use any products that contain nicotine or tobacco, such as cigarettes and e-cigarettes. These can delay incision or bone healing. If you need help quitting, ask your health care provider. °· Keep all follow-up visits as told by your health care provider. This is important. °Contact a health care provider if you: °· Have a fever. °· Have severe pain. °· Have redness around an incision. °· Have more swelling or pain in an incision area. °· Have blood or more fluid coming from an incision. °· Notice that an incision feels warm to the touch. °· Notice pus or a bad smell coming from an incision. °· Notice that an incision has opened up. °· Develop a rash. °Get help right away if you: °· Have difficulty breathing. °· Have chest pain. °· Notice that your fingers tingle, are numb, or are cold and blue even after you loosen your sling or immobilizer. °· Develop pain in your lower leg or at the back of your knee. °Summary °· If you have a sling or immobilizer, wear it as told by your health care provider. These devices protect your shoulder and help it heal by keeping it in place. °· If lying down flat causes shoulder discomfort, it may help to sleep in a sitting position for a few days after your procedure. Try sleeping in a reclining chair, or try propping yourself up with extra pillows in bed. °· If physical therapy was prescribed, do exercises as directed. Doing exercises may help to improve shoulder movement and flexibility (range of motion). °· Keep all follow-up visits as told by your health care provider. This is important. °This information is not intended to replace advice given to you by your health care provider. Make sure you discuss any questions you have with your health care provider. °Document Released: 12/08/2013 Document Revised: 03/28/2017 Document Reviewed: 03/28/2017 °Elsevier  Interactive Patient Education © 2019 [employer] ° ° °AMBULATORY SURGERY  °DISCHARGE INSTRUCTIONS ° ° °1) The drugs that you were given will stay in your system until tomorrow so for the next 24 hours you should not: ° °A) Drive an automobile °B) Make any legal decisions °C) Drink any alcoholic beverage ° ° °2) You may resume regular meals tomorrow.  Today it is better to start with liquids and gradually work up to solid foods. ° °You may eat anything you prefer, but it is better to start with liquids, then soup and crackers, and gradually work up to solid foods. ° ° °3) Please notify your doctor immediately if you have any unusual bleeding, trouble breathing, redness and pain at the surgery site, drainage, fever, or pain not relieved by medication. ° ° ° °4) Additional Instructions: ° ° ° ° ° ° ° °Please contact your physician with any problems or Same Day Surgery at [phone], Monday through Friday 6 am to 4 pm, or   [facility] Main number at [phone]. °

## 2018-06-23 NOTE — Anesthesia Procedure Notes (Signed)
Anesthesia Regional Block: Interscalene brachial plexus block   Pre-Anesthetic Checklist: ,, timeout performed, Correct Patient, Correct Site, Correct Laterality, Correct Procedure, Correct Position, site marked, Risks and benefits discussed,  Surgical consent,  Pre-op evaluation,  At surgeon's request and post-op pain management  Laterality: Left  Prep: chloraprep       Needles:  Injection technique: Single-shot  Needle Type: Stimiplex     Needle Length: 9cm  Needle Gauge: 21     Additional Needles:   Procedures:,,,, ultrasound used (permanent image in chart),,,,  Narrative:  Start time: 06/23/2018 8:41 AM End time: 06/23/2018 8:55 AM  Performed by: Personally  Anesthesiologist: Durenda Hurt, MD  Additional Notes: Negative paresthesia on injection.  Negative aspiration.  Dose given in divided aliquots under ultrasound guidance.

## 2018-06-23 NOTE — Transfer of Care (Signed)
Immediate Anesthesia Transfer of Care Note  Patient: Matthew Burns  Procedure(s) Performed: SHOULDER ARTHROSCOPY WITH LABRAL REPAIR (Left )  Patient Location: PACU  Anesthesia Type:General  Level of Consciousness: awake and alert   Airway & Oxygen Therapy: Patient Spontanous Breathing and Patient connected to face mask oxygen  Post-op Assessment: Report given to RN and Post -op Vital signs reviewed and stable  Post vital signs: Reviewed and stable  Last Vitals:  Vitals Value Taken Time  BP    Temp    Pulse 108 06/23/2018  2:16 PM  Resp 15 06/23/2018  2:16 PM  SpO2 95 % 06/23/2018  2:16 PM  Vitals shown include unvalidated device data.  Last Pain:  Vitals:   06/23/18 0803  TempSrc: Oral  PainSc: 3       Patients Stated Pain Goal: 3 (89/21/19 4174)  Complications: No apparent anesthesia complications

## 2018-06-23 NOTE — Anesthesia Preprocedure Evaluation (Addendum)
Anesthesia Evaluation  Patient identified by MRN, date of birth, ID band Patient awake    Reviewed: Allergy & Precautions, H&P , NPO status , Patient's Chart, lab work & pertinent test results  Airway Mallampati: III      Comment: beard Dental  (+) Chipped   Pulmonary Current Smoker,           Cardiovascular hypertension, negative cardio ROS       Neuro/Psych negative neurological ROS  negative psych ROS   GI/Hepatic Neg liver ROS, GERD  Controlled,  Endo/Other  diabetes  Renal/GU      Musculoskeletal   Abdominal   Peds  Hematology negative hematology ROS (+)   Anesthesia Other Findings Obese  Past Medical History: No date: BPH (benign prostatic hypertrophy) No date: Diabetes mellitus     Comment:  typeII No date: ED (erectile dysfunction) No date: GERD (gastroesophageal reflux disease)     Comment:  OCC -NO MEDS No date: Hypertension  Past Surgical History: 10/04/2016: COLONOSCOPY WITH PROPOFOL; N/A     Comment:  Procedure: COLONOSCOPY WITH PROPOFOL;  Surgeon: Manya Silvas, MD;  Location: [facility] ENDOSCOPY;  Service:               Endoscopy;  Laterality: N/A; 2004: EYE SURGERY     Comment:  Lasik 1991: planter fascitis surgery  BMI    Body Mass Index:  30.34 kg/m      Reproductive/Obstetrics negative OB ROS                            Anesthesia Physical Anesthesia Plan  ASA: III  Anesthesia Plan: General ETT and Regional   Post-op Pain Management:    Induction:   PONV Risk Score and Plan: Ondansetron, Dexamethasone, Midazolam and Treatment may vary due to age or medical condition  Airway Management Planned:   Additional Equipment:   Intra-op Plan:   Post-operative Plan:   Informed Consent: I have reviewed the patients History and Physical, chart, labs and discussed the procedure including the risks, benefits and alternatives for the proposed  anesthesia with the patient or authorized representative who has indicated his/her understanding and acceptance.     Dental Advisory Given  Plan Discussed with: Anesthesiologist, CRNA and Surgeon  Anesthesia Plan Comments:         Anesthesia Quick Evaluation

## 2018-06-23 NOTE — Anesthesia Procedure Notes (Signed)
Procedure Name: Intubation Date/Time: 06/23/2018 10:40 AM Performed by: Philbert Riser, CRNA Pre-anesthesia Checklist: Patient identified, Emergency Drugs available, Suction available, Patient being monitored and Timeout performed Patient Re-evaluated:Patient Re-evaluated prior to induction Oxygen Delivery Method: Circle system utilized and Simple face mask Preoxygenation: Pre-oxygenation with 100% oxygen Induction Type: IV induction Ventilation: Two handed mask ventilation required and Oral airway inserted - appropriate to patient size Laryngoscope Size: McGraph and 4 Grade View: Grade III Tube type: Oral Tube size: 7.5 mm Number of attempts: 3 Airway Equipment and Method: Stylet and Bougie stylet Placement Confirmation: ETT inserted through vocal cords under direct vision,  positive ETCO2 and breath sounds checked- equal and bilateral Secured at: 22 cm Tube secured with: Tape

## 2018-06-23 NOTE — H&P (Signed)
PREOPERATIVE H&P  Chief Complaint: LEFT GLENOID LABRUM TEAR  HPI: Matthew Burns is a 63 y.o. male who presents for preoperative history and physical with a diagnosis of LEFT GLENOID LABRUM TEAR. Symptoms are rated as moderate to severe, and have been worsening.  This is significantly impairing activities of daily living.  He has elected for surgical management.   Past Medical History:  Diagnosis Date  . BPH (benign prostatic hypertrophy)   . Diabetes mellitus    typeII  . ED (erectile dysfunction)   . GERD (gastroesophageal reflux disease)    OCC -NO MEDS  . Hypertension    Past Surgical History:  Procedure Laterality Date  . COLONOSCOPY WITH PROPOFOL N/A 10/04/2016   Procedure: COLONOSCOPY WITH PROPOFOL;  Surgeon: Manya Silvas, MD;  Location: [facility] ENDOSCOPY;  Service: Endoscopy;  Laterality: N/A;  . EYE SURGERY  2004   Lasik  . planter fascitis surgery  1991   Social History   Socioeconomic History  . Marital status: Married    Spouse name: Not on file  . Number of children: Not on file  . Years of education: Not on file  . Highest education level: Not on file  Occupational History  . Not on file  Social Needs  . Financial resource strain: Not on file  . Food insecurity:    Worry: Not on file    Inability: Not on file  . Transportation needs:    Medical: Not on file    Non-medical: Not on file  Tobacco Use  . Smoking status: Current Some Day Smoker    Types: Cigars    Last attempt to quit: 08/09/2010    Years since quitting: 7.8  . Smokeless tobacco: Never Used  . Tobacco comment: 4 cigars a week.  Substance and Sexual Activity  . Alcohol use: Yes    Comment: RARE  . Drug use: No  . Sexual activity: Not on file  Lifestyle  . Physical activity:    Days per week: Not on file    Minutes per session: Not on file  . Stress: Not on file  Relationships  . Social connections:    Talks on phone: Not on file    Gets together: Not on file    Attends  religious service: Not on file    Active member of club or organization: Not on file    Attends meetings of clubs or organizations: Not on file    Relationship status: Not on file  Other Topics Concern  . Not on file  Social History Narrative   No regular exercise    From [place], Enjoys golf   Education: Therapist, occupational   Married: 1987;  2 kids , 2 grandchildren   Network engineer, has CDL   Family History  Problem Relation Age of Onset  . Diabetes Mother        glaucoma  . Glaucoma Mother   . Heart disease Father        CAD s/p stent//glaucoma  . Prostate cancer Father        local radiation, no chemo  . Heart attack Father   . Glaucoma Father   . Diabetes Maternal Grandmother   . Diabetes Maternal Grandfather   . Diabetes Paternal Grandmother   . Diabetes Paternal Grandfather   . Colon cancer Neg Hx    Allergies  Allergen Reactions  . Metformin And Related     Can tolerate 1000mg  max per day  . Viagra [Sildenafil Citrate]  Headache (tolerated cialis)   Prior to Admission medications   Medication Sig Start Date End Date Taking? Authorizing Provider  glipiZIDE (GLUCOTROL XL) 5 MG 24 hr tablet Take 5 mg by mouth daily with supper. 05/09/18  Yes [provider]  ibuprofen (ADVIL,MOTRIN) 200 MG tablet Take 600 mg by mouth every 8 (eight) hours as needed (for pain.).   Yes [provider]  INVOKAMET 150-500 MG TABS Take 1 tablet by mouth daily with supper. 06/08/18  Yes [provider]  Lancets MISC Use as directed 01/21/11  Yes Tonia Ghent, MD  losartan (COZAAR) 50 MG tablet Take 50 mg by mouth daily with supper. 03/15/18  Yes [provider]  rosuvastatin (CRESTOR) 10 MG tablet Take 10 mg by mouth daily. 06/15/18   [provider]     Positive ROS: All other systems have been reviewed and were otherwise negative with the exception of those mentioned in the HPI and as above.  Physical Exam: General: Alert, no acute  distress Cardiovascular: Regular rate and rhythm, no murmurs rubs or gallops.  No pedal edema Respiratory: Clear to auscultation bilaterally, no wheezes rales or rhonchi. No cyanosis, no use of accessory musculature GI: No organomegaly, abdomen is soft and non-tender nondistended with positive bowel sounds. Skin: Skin intact, no lesions within the operative field. Neurologic: Sensation intact distally Psychiatric: Patient is competent for consent with normal mood and affect Lymphatic: No cervical lymphadenopathy  MUSCULOSKELETAL: LEFT SHOULDER: Patient has intact skin.  There is no erythema ecchymosis or swelling.  Patient can forward elevate and abduct approximate 150 degrees but has pain in the mid range of abduction.  He demonstrates no weakness of the rotator cuff.  Patient had pain with impingement testing but no apprehension or instability.  Patient had pain with O'Brien's test with a posteriorly directed force on his abducted flexed and internally rotated shoulder.  He is neurovascular intact.  He has full digital wrist and elbow range of motion, intact sensation light touch and palpable pedal pulses.  He has tenderness over the [facility] joint but no step-off.  Assessment: LEFT GLENOID LABRUM TEAR  Plan: Plan for Procedure(s): LEFT SHOULDER ARTHROSCOPIC LABRAL REPAIR  I have explained the details of the operation as well as the postoperative course with the patient and his wife.  I answered all their questions.  I discussed the risks and benefits of surgery. The risks include but are not limited to infection, bleeding, nerve or blood vessel injury, joint stiffness or loss of motion, persistent pain, weakness or instability, re-tear of the labrum and hardware failure and the need for further surgery. Medical risks include but are not limited to DVT and pulmonary embolism, myocardial infarction, stroke, pneumonia, respiratory failure and death. Patient understood these risks and wished to proceed.      Thornton Park, MD   06/23/2018 10:18 AM

## 2018-06-23 NOTE — Op Note (Addendum)
06/23/2018  2:10 PM  PATIENT:  Matthew Burns  63 y.o. male  PRE-OPERATIVE DIAGNOSIS:  LEFT GLENOID LABRUM TEAR AND ACROMIAL CLAVICULAR JOINT ARTHROSIS  POST-OPERATIVE DIAGNOSIS:  LEFT ANTERIOR GLENOID LABRUM TEAR, SLAP TEAR, SUBACROMIAL DECOMPRESSION AND DISTAL CLAVICLE EXCISION  PROCEDURE:  Procedure(s): LEFT SHOULDER ARTHROSCOPIC ANTERIOR LABRAL REPAIR, SLAP REPAIR, SUBACROMIAL DECOMPRESSION AND DISTAL CLAVICLE EXCISION  SURGEON:  Surgeon(s) and Role:    Thornton Park, MD - Primary  ANESTHESIA:   general and paracervical block   PREOPERATIVE INDICATIONS:  ELESTER APODACA is a  63 y.o. male with a diagnosis of LEFT GLENOID LABRUM TEAR who failed conservative measures and elected for surgical management.    I discussed the risks and benefits of surgery. The risks include but are not limited to infection, bleeding, nerve or blood vessel injury, joint stiffness or loss of motion, persistent pain, weakness or instability, and hardware failure and the need for further surgery. Medical risks include but are not limited to DVT and pulmonary embolism, myocardial infarction, stroke, pneumonia, respiratory failure and death. Patient understood these risks and wished to proceed.   OPERATIVE IMPLANTS: Arthrex bio suturetak anchors  5   OPERATIVE FINDINGS:  1.  Left shoulder anterior inferior labral tear  2.  SLAP tear extending into posterior labrum  OPERATIVE PROCEDURE:  I met with the patient and his wife in the preoperative area.  I signed the left shoulder according the hospital's correct site of surgery protocol.  I answered all questions by the family.  A preop history and physical was performed at the bedside.  Patient received an interscalene block with Exparel by the anesthesia service in the preoperative area.  Patient was brought to the operating room.  He underwent general endotracheal intubation.  The patient was then positioned in a beachchair position. All bony  prominences were adequately padded including the lower extremities.  Examination under anesthesia revealed no excessive laxity with load and shift testing, and a negative sulcus sign testing respectively.  Patient had approximately 140 degrees of forward elevation passively.  With 90 degrees of adduction the patient could be passively externally rotated to approximately 80 degrees.  Internal rotation was approximately 50 degrees.  The patient was then prepped and draped in a sterile fashion. The patient received 2 g of Ancef prior to the onset of the case.  A timeout was performed to verify the patient's name, date of birth, medical record number, correct site of surgery and correct procedure to be performed.The timeout was also used to verify the patient received antibiotics that all appropriate instruments, implants and radiographic studies were available in the room. Once all in attendance were in agreement case began.  Bony landmarks were drawn out with a surgical marker along with proposed incisions. These were pre-injected with 1% lidocaine plain. An 11 blade was used to establish a posterior portal through which the arthroscope was placed in the glenohumeral joint. An anterior portal was established under direct visualization using an 18-gauge spinal needle for localization. A 5.75 mm arthroscopic cannula was inserted through the anterior portal. A full diagnostic examination of the glenohumeral joint was performed.  Findings on arthroscopy included an anterior inferior labral tear, SLAP tear destabilizing the biceps anchor which extended to approximately 2:00 posteriorly.  The anterior inferior labrum from 3:00 to 6:00 was intact.  There was no tear to the biceps tendon.  The rotator cuff including the subscapularis was intact.    An anterolateral portal was established again under direct visualization  using an 18-gauge spinal needle. A 7 mm cannula was placed through this anterolateral portal.    The anterior inferior labrum was mobilized using an arthroscopic periosteal elevator.  The nonarticular portion of anterior glenoid was debrided with a 4.82m resector shaver blade. An Arthrex BNIKEanchor was then placed at the 5 pm postion.  A 90 degree Arthrex suture lasso was then used to shuttle a single limb of this anchor through the anterior inferior capsule.  Arthroscopic knot tying technique was then used to complete the capsulorrhaphy anteriorly.  This process was repeated to place an anchor at the 3 o'clock position.  After placement of both anterior anchors the anterior inferior labrum was probed and deemed to be stable.  The attention was then turned to repair of the SLAP tear.  Patient had the superior, nonarticular portion of the glenoid debrided with an arthroscopic rasp, a 4.0 mm resector shaver blade and ring curette.  A single Arthrex  Biosuture tak anchor was placed just anterior to the biceps anchor.  A 90 degrees suture lasso was used to shuttle a single limb of this anchor under the labrum.  An arthroscopic knot tying technique was used to approximate the labrum to the superior glenoid.  An Arthrex percutaneous set was used to establish a drill guide to allow for placement of 2 bio suture tack anchors posterior to the biceps anchor.  One anchor was positioned at approximately 1230 and the other at 2:00.   A switching stick was used to place a 7 mm cannula through the posterior portal.  This allowed for passage of the suture lasso.  Again arthroscopic knot tying technique was used to approximate the superior posterior labrum to the glenoid.  The repair was then probed and found to be stable.  Final arthroscopic images of the repair were taken.  Arthroscopic instruments were then removed.  The arthroscope was placed into the subacromial space.  A lateral portal was established under direct visualization.  A 4.0 mm resector shaver blade and 90 degree ArthroCare wand were then used to  debride the extensive bursitis encountered in the subacromial space.  The rotator cuff was then examined and found to have no evidence of a bursal sided tear.  A subacromial decompression was performed with a 5.5 mm resector shaver blade from the lateral portal.  The 5.5 mm resector shaver blade was then placed to the anterior portal and a distal clavicle excision was performed.  Patient had evidence of hypertrophic AC joint arthrosis on his MRI.  Subacromial space with then copiously irrigated and lavaged to remove osseous debris.  All arthroscopic instruments were removed. The five portals were closed with 4-0 nylon. A dry, sterile dressing was applied to the left shoulder, along with a Polar Care sleeve. Patient's left arm was then placed in an abduction sling. He was awoken and brought to PACU in stable condition. I was scrubbed and present for the entire case and all sharp and instrument counts were correct at the conclusion the case. I spoke with the patient's wife in the postop consultation room to let her know the operation was successful and performed without complication.      KTimoteo Gaul MD

## 2018-06-24 ENCOUNTER — Encounter: Payer: Self-pay | Admitting: Orthopedic Surgery

## 2018-06-24 NOTE — Anesthesia Postprocedure Evaluation (Signed)
Anesthesia Post Note  Patient: Matthew Burns  Procedure(s) Performed: SHOULDER ARTHROSCOPY WITH LABRAL REPAIR (Left ) SUBACROMIAL DECOMPRESSION (Left )  Patient location during evaluation: PACU Anesthesia Type: Regional and General Level of consciousness: awake and alert Pain management: pain level controlled Vital Signs Assessment: post-procedure vital signs reviewed and stable Respiratory status: spontaneous breathing, nonlabored ventilation and respiratory function stable Cardiovascular status: blood pressure returned to baseline and stable Postop Assessment: no apparent nausea or vomiting Anesthetic complications: no     Last Vitals:  Vitals:   06/23/18 1510 06/23/18 1608  BP: (!) 175/95 131/76  Pulse: (!) 105 99  Resp: 18 18  Temp: (!) 36 C (!) 36.1 C  SpO2: 92% 92%    Last Pain:  Vitals:   06/23/18 1510  TempSrc: Temporal  PainSc: 0-No pain                 Durenda Hurt

## 2018-07-01 DIAGNOSIS — M6281 Muscle weakness (generalized): Secondary | ICD-10-CM | POA: Diagnosis not present

## 2018-07-01 DIAGNOSIS — M25512 Pain in left shoulder: Secondary | ICD-10-CM | POA: Diagnosis not present

## 2018-07-07 DIAGNOSIS — M6281 Muscle weakness (generalized): Secondary | ICD-10-CM | POA: Diagnosis not present

## 2018-07-07 DIAGNOSIS — M25512 Pain in left shoulder: Secondary | ICD-10-CM | POA: Diagnosis not present

## 2018-07-09 DIAGNOSIS — M6281 Muscle weakness (generalized): Secondary | ICD-10-CM | POA: Diagnosis not present

## 2018-07-09 DIAGNOSIS — M25512 Pain in left shoulder: Secondary | ICD-10-CM | POA: Diagnosis not present

## 2018-07-14 DIAGNOSIS — M6281 Muscle weakness (generalized): Secondary | ICD-10-CM | POA: Diagnosis not present

## 2018-07-14 DIAGNOSIS — M25512 Pain in left shoulder: Secondary | ICD-10-CM | POA: Diagnosis not present

## 2018-07-16 DIAGNOSIS — M25512 Pain in left shoulder: Secondary | ICD-10-CM | POA: Diagnosis not present

## 2018-07-16 DIAGNOSIS — M6281 Muscle weakness (generalized): Secondary | ICD-10-CM | POA: Diagnosis not present

## 2018-07-20 DIAGNOSIS — M6281 Muscle weakness (generalized): Secondary | ICD-10-CM | POA: Diagnosis not present

## 2018-07-20 DIAGNOSIS — M25512 Pain in left shoulder: Secondary | ICD-10-CM | POA: Diagnosis not present

## 2018-07-22 DIAGNOSIS — M6281 Muscle weakness (generalized): Secondary | ICD-10-CM | POA: Diagnosis not present

## 2018-07-22 DIAGNOSIS — M25512 Pain in left shoulder: Secondary | ICD-10-CM | POA: Diagnosis not present

## 2018-07-28 DIAGNOSIS — M6281 Muscle weakness (generalized): Secondary | ICD-10-CM | POA: Diagnosis not present

## 2018-07-28 DIAGNOSIS — M25512 Pain in left shoulder: Secondary | ICD-10-CM | POA: Diagnosis not present

## 2018-07-30 DIAGNOSIS — M25512 Pain in left shoulder: Secondary | ICD-10-CM | POA: Diagnosis not present

## 2018-07-30 DIAGNOSIS — M6281 Muscle weakness (generalized): Secondary | ICD-10-CM | POA: Diagnosis not present

## 2018-08-04 DIAGNOSIS — M6281 Muscle weakness (generalized): Secondary | ICD-10-CM | POA: Diagnosis not present

## 2018-08-04 DIAGNOSIS — M25512 Pain in left shoulder: Secondary | ICD-10-CM | POA: Diagnosis not present

## 2018-08-06 DIAGNOSIS — M6281 Muscle weakness (generalized): Secondary | ICD-10-CM | POA: Diagnosis not present

## 2018-08-06 DIAGNOSIS — M25512 Pain in left shoulder: Secondary | ICD-10-CM | POA: Diagnosis not present

## 2018-08-11 DIAGNOSIS — M6281 Muscle weakness (generalized): Secondary | ICD-10-CM | POA: Diagnosis not present

## 2018-08-11 DIAGNOSIS — M25512 Pain in left shoulder: Secondary | ICD-10-CM | POA: Diagnosis not present

## 2018-08-13 DIAGNOSIS — M6281 Muscle weakness (generalized): Secondary | ICD-10-CM | POA: Diagnosis not present

## 2018-08-13 DIAGNOSIS — M25512 Pain in left shoulder: Secondary | ICD-10-CM | POA: Diagnosis not present

## 2018-08-18 DIAGNOSIS — M6281 Muscle weakness (generalized): Secondary | ICD-10-CM | POA: Diagnosis not present

## 2018-08-18 DIAGNOSIS — M25512 Pain in left shoulder: Secondary | ICD-10-CM | POA: Diagnosis not present

## 2018-08-20 DIAGNOSIS — M25512 Pain in left shoulder: Secondary | ICD-10-CM | POA: Diagnosis not present

## 2018-08-20 DIAGNOSIS — M6281 Muscle weakness (generalized): Secondary | ICD-10-CM | POA: Diagnosis not present

## 2018-08-26 DIAGNOSIS — M6281 Muscle weakness (generalized): Secondary | ICD-10-CM | POA: Diagnosis not present

## 2018-08-26 DIAGNOSIS — M25512 Pain in left shoulder: Secondary | ICD-10-CM | POA: Diagnosis not present

## 2018-08-28 DIAGNOSIS — M25512 Pain in left shoulder: Secondary | ICD-10-CM | POA: Diagnosis not present

## 2018-08-28 DIAGNOSIS — M6281 Muscle weakness (generalized): Secondary | ICD-10-CM | POA: Diagnosis not present

## 2018-09-28 IMAGING — CR DG CHEST 2V
1 series · 2 of 2 positions shown · non-contrast
Comparison: None.

CLINICAL DATA: Patient reports chest congestion and non-productive
cough x3 months. Hx HTN, DM. Current smoker.

EXAM:
CHEST  2 VIEW

[Series 1: w chest pa · 0.14mm/px · 2 of 2 slices shown]
[im 1/2]
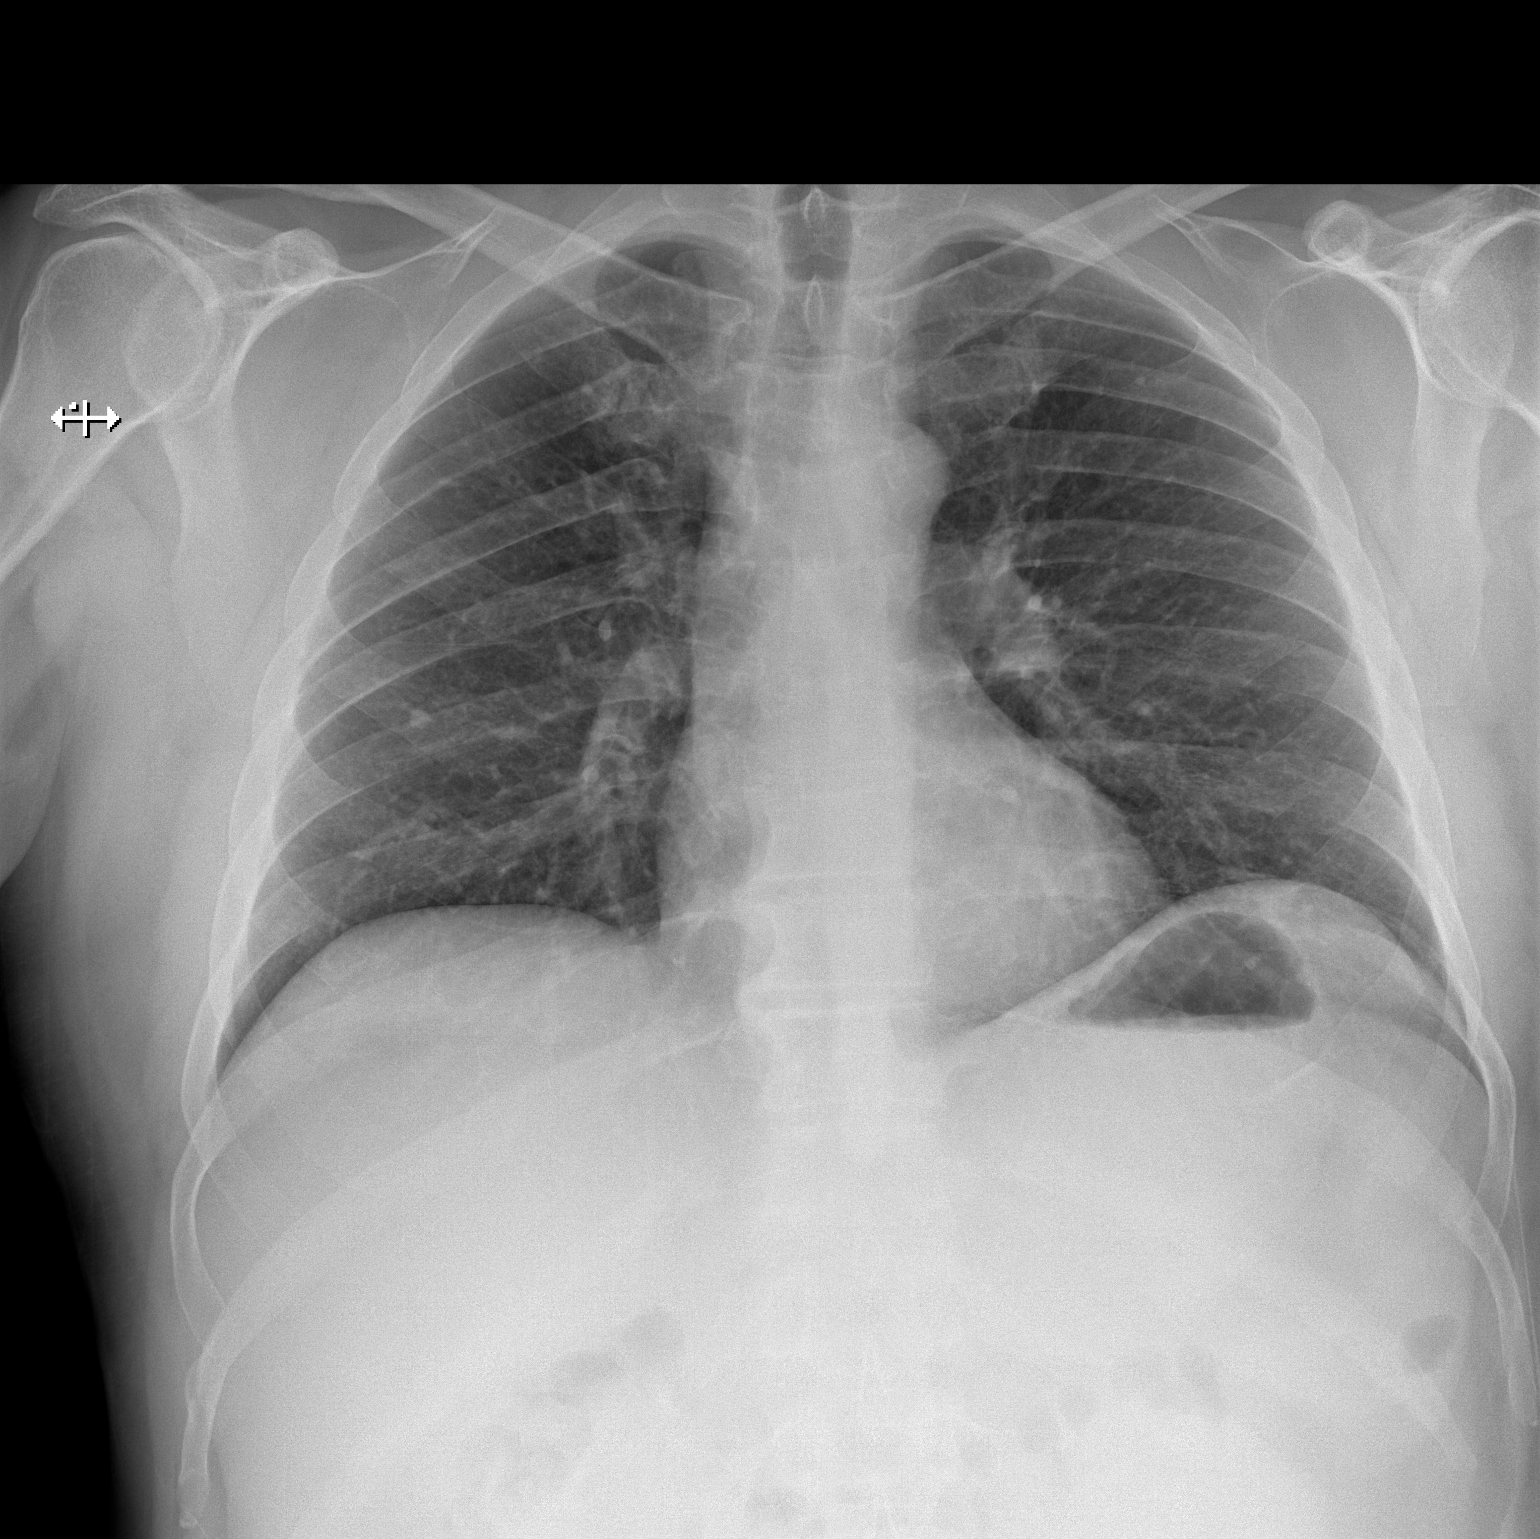
[im 2/2]
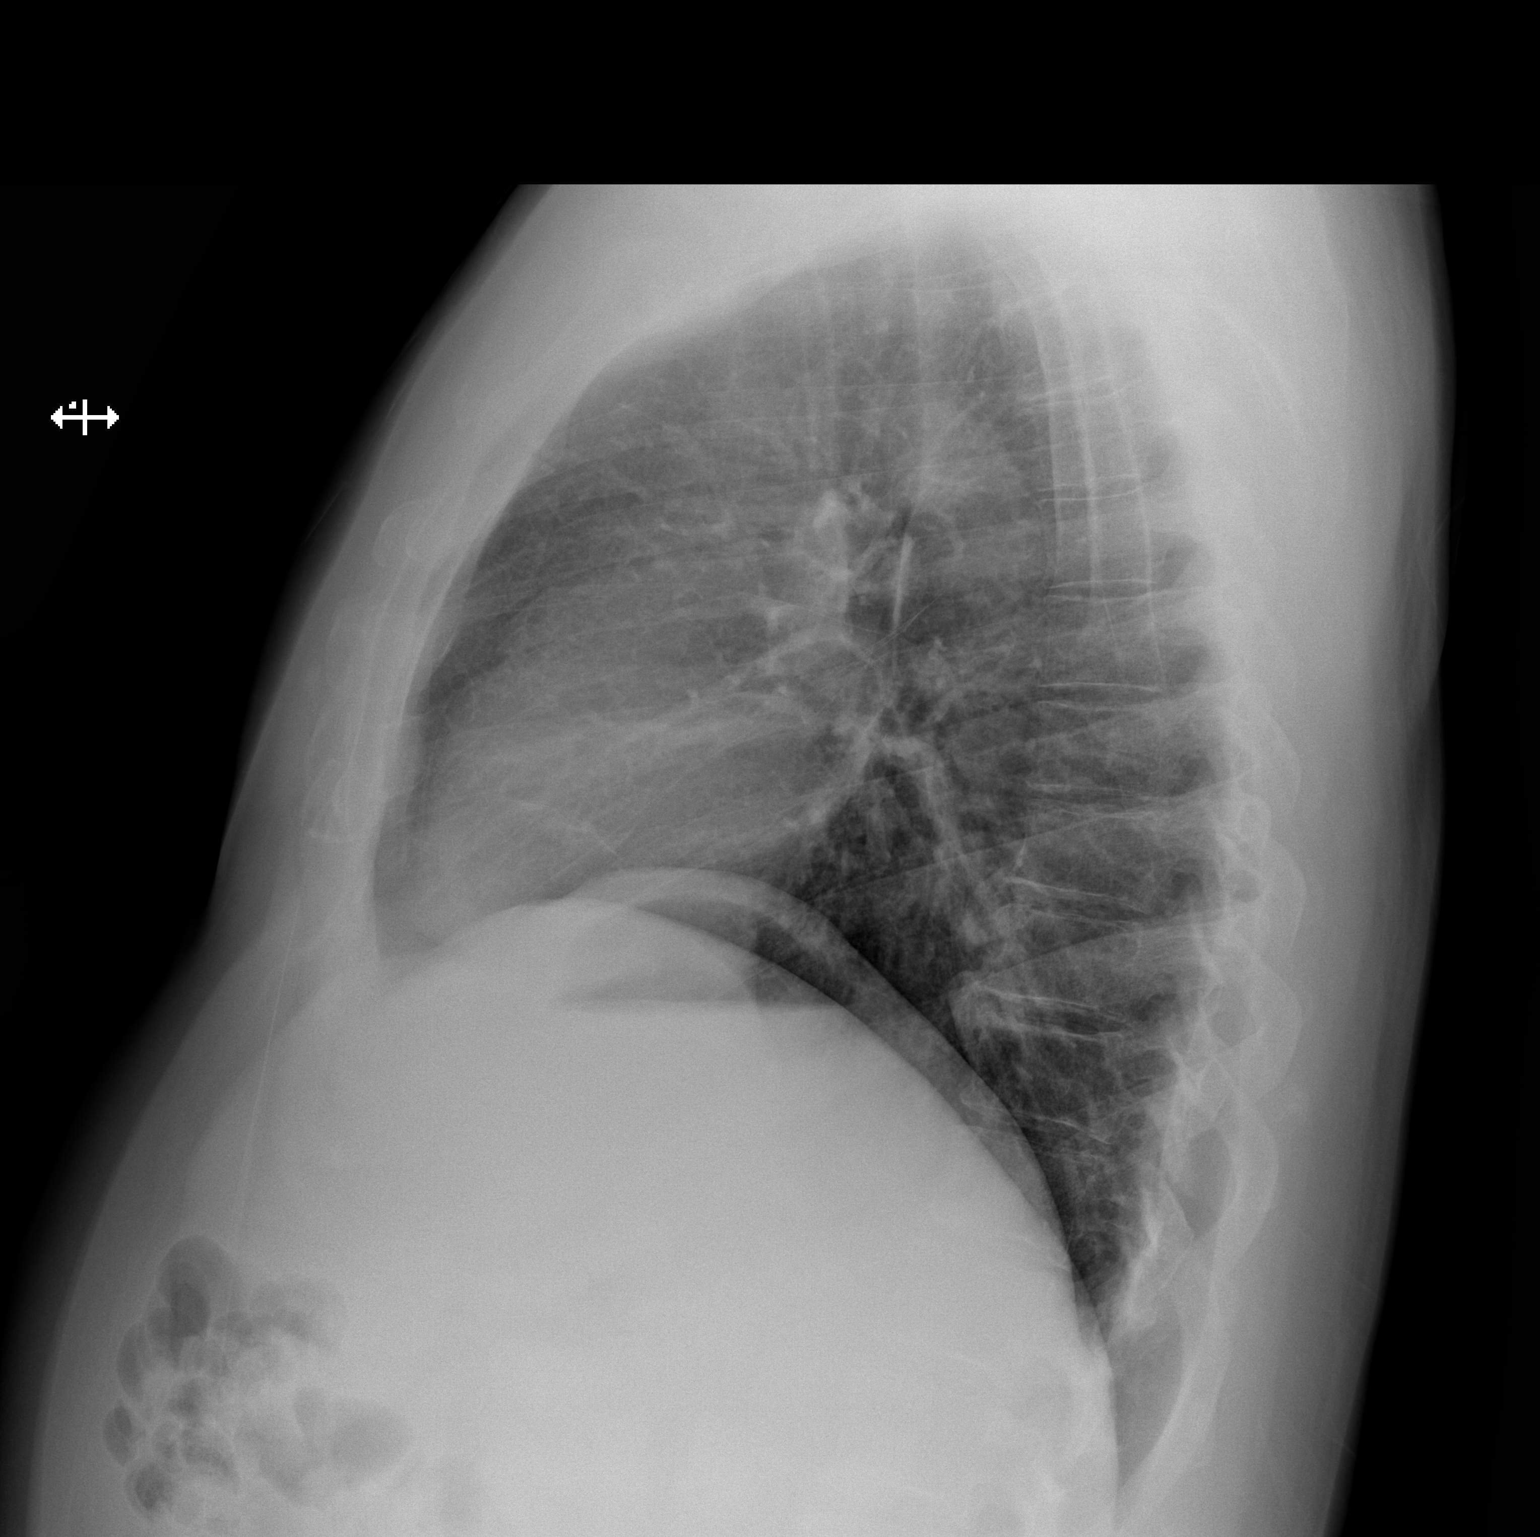

[2 of 2 positions shown; findings below may reference images not displayed]

FINDINGS: Normal mediastinum and cardiac silhouette. Normal pulmonary
vasculature. Small dense nodule in the RIGHT lung measuring 4 mm is
likely a benign calcified granuloma. No evidence of effusion,
infiltrate, or pneumothorax. No acute bony abnormality. Degenerative
osteophytosis of the lower thoracic spine.
IMPRESSION: No active cardiopulmonary disease.

Probable calcified pulmonary nodule. Consider follow-up chest
radiograph in 6 to 12 months if patient has smoking history.

## 2019-03-22 ENCOUNTER — Other Ambulatory Visit: Payer: Self-pay

## 2019-03-22 DIAGNOSIS — Z20822 Contact with and (suspected) exposure to covid-19: Secondary | ICD-10-CM

## 2019-03-24 LAB — NOVEL CORONAVIRUS, NAA: SARS-CoV-2, NAA: NOT DETECTED
